# Patient Record
Sex: Female | Born: 1950
Health system: Southern US, Community
[De-identification: ages and names within clinical notes are randomized; demographics above are authoritative.]

## PROBLEM LIST (undated history)

## (undated) DIAGNOSIS — R55 Syncope and collapse: Secondary | ICD-10-CM

## (undated) DIAGNOSIS — T7840XA Allergy, unspecified, initial encounter: Secondary | ICD-10-CM

## (undated) DIAGNOSIS — C44311 Basal cell carcinoma of skin of nose: Secondary | ICD-10-CM

## (undated) HISTORY — DX: Syncope and collapse: R55

## (undated) HISTORY — DX: Basal cell carcinoma of skin of nose: C44.311

## (undated) HISTORY — DX: Allergy, unspecified, initial encounter: T78.40XA

---
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
## 2005-11-02 ENCOUNTER — Ambulatory Visit: Payer: Self-pay | Admitting: Internal Medicine

## 2005-11-16 ENCOUNTER — Other Ambulatory Visit: Admission: RE | Admit: 2005-11-16 | Discharge: 2005-11-16 | Payer: Self-pay | Admitting: Family Medicine

## 2005-11-16 ENCOUNTER — Ambulatory Visit: Payer: Self-pay | Admitting: Family Medicine

## 2005-11-16 ENCOUNTER — Encounter: Payer: Self-pay | Admitting: Family Medicine

## 2005-12-01 ENCOUNTER — Encounter: Admission: RE | Admit: 2005-12-01 | Discharge: 2005-12-01 | Payer: Self-pay | Admitting: Family Medicine

## 2006-05-24 ENCOUNTER — Emergency Department (HOSPITAL_COMMUNITY): Admission: EM | Admit: 2006-05-24 | Discharge: 2006-05-24 | Payer: Self-pay | Admitting: Emergency Medicine

## 2009-04-14 ENCOUNTER — Telehealth: Payer: Self-pay | Admitting: Family Medicine

## 2009-06-26 ENCOUNTER — Emergency Department (HOSPITAL_BASED_OUTPATIENT_CLINIC_OR_DEPARTMENT_OTHER): Admission: EM | Admit: 2009-06-26 | Discharge: 2009-06-26 | Payer: Self-pay | Admitting: Emergency Medicine

## 2010-03-29 NOTE — Progress Notes (Signed)
Summary: SICK WITH STOMACH BUG--NEEDS SUPPOSITORY//FYI ED- call tomorrow  Phone Note Call from Patient Call back at Home Phone [phone] Call back at [phone]  OR [phone]   Caller: Daughter Summary of Call: PATIENTS DAUGHTER CAME IN--FILLED OUT TRIAGE FORM STATING THAT HER MOM WAS REALLY SICK WITH STOMACH BUG---TOO SICK TO COME IN FOR AN OFFICE VISIT  DAUGHTER SAYS SHE NEEDS A "NAUSEA SUPPOSITORY MEDICATION" CALLED IN TO [facility] ON [address]  PATIENT HAS SEEN DR PAZ ONCE IN 11/02/2005 FOR A PHYSICAL Initial call taken by: Jerolyn Shin,  April 14, 2009 10:48 AM  Follow-up for Phone Call        Spoke with pt husband, who says wife has N & V since 3 am, wondering if flu, wanted to know if we could give rx for suppository. Pt not seen since 11/16/05 by Dr Laury Axon  Recommend pt to ED since signs so severe and pt may need IV fluids. Can make ov followup, but recommend ED for today,  husband agreed will go to [facility] .Kandice Hams  April 14, 2009 11:24 AM  Follow-up by: Kandice Hams,  April 14, 2009 11:24 AM  Additional Follow-up for Phone Call Additional follow up Details #1::        call tomorrow to see how she is  Additional Follow-up by: Loreen Freud DO,  April 14, 2009 12:33 PM    Additional Follow-up for Phone Call Additional follow up Details #2::    Spoke with husband he said he could not get her to hospital yesterday, she is weak today. She is able to keep toast and milk down today though. No fever though. Army Fossa CMA  April 15, 2009 9:49 AM   Additional Follow-up for Phone Call Additional follow up Details #3:: Details for Additional Follow-up Action Taken: does she still need phenergan?  Supp or by mouth?  Pts husband says he does not think she needs this now, no longer having diarrhea or vomitting now. Army Fossa CMA  April 15, 2009 11:56 AM  Additional Follow-up by: Loreen Freud DO,  April 15, 2009 10:37 AM

## 2011-12-06 ENCOUNTER — Encounter: Payer: Self-pay | Admitting: Family Medicine

## 2011-12-06 ENCOUNTER — Ambulatory Visit (INDEPENDENT_AMBULATORY_CARE_PROVIDER_SITE_OTHER): Payer: BC Managed Care – PPO | Admitting: Family Medicine

## 2011-12-06 VITALS — BP 101/81 | HR 90 | Temp 98.1°F | Ht 62.25 in | Wt 124.0 lb

## 2011-12-06 DIAGNOSIS — J309 Allergic rhinitis, unspecified: Secondary | ICD-10-CM

## 2011-12-06 DIAGNOSIS — R0982 Postnasal drip: Secondary | ICD-10-CM | POA: Insufficient documentation

## 2011-12-06 MED ORDER — MOMETASONE FUROATE 50 MCG/ACT NA SUSP: 2.0000 | Freq: Every day | NASAL | Status: DC

## 2011-12-06 NOTE — Progress Notes (Signed)
  Subjective:    Patient ID: Jill Norris, female    DOB: 24-Jul-1950, 61 y.o.   MRN: [id number]  HPI URI- chronic nasal congestion, clear nasal drainage.  'unable to blow it out but i can spit it out'.  +PND, cough- sounds wet, but is mostly dry.  Had no sxs while on recent cruise.  No fevers.  No ear pain.  Denies facial pain/pressure.  No hx of seasonal allergies.  Not currently on meds.  sxs have been intermittent for over a year.   Review of Systems     Objective:   Physical Exam  Vitals reviewed. Constitutional: She appears well-developed and well-nourished. No distress.  HENT:  Head: Normocephalic and atraumatic.  Right Ear: Tympanic membrane normal.  Left Ear: Tympanic membrane normal.  Nose: Mucosal edema and rhinorrhea present. Right sinus exhibits no maxillary sinus tenderness and no frontal sinus tenderness. Left sinus exhibits no maxillary sinus tenderness and no frontal sinus tenderness.  Mouth/Throat: Mucous membranes are normal. Posterior oropharyngeal erythema (w/ PND) present.  Eyes: Conjunctivae normal and EOM are normal. Pupils are equal, round, and reactive to light.  Neck: Normal range of motion. Neck supple.  Cardiovascular: Normal rate, regular rhythm and normal heart sounds.   Pulmonary/Chest: Effort normal and breath sounds normal. No respiratory distress. She has no wheezes. She has no rales.  Lymphadenopathy:    She has no cervical adenopathy.          Assessment & Plan:

## 2011-12-06 NOTE — Assessment & Plan Note (Signed)
New.  Likely the cause of pt's ongoing cough.  Start OTC antihistamine, nasal steroid spray- sample and prescription given.  Reviewed supportive care and red flags that should prompt return.  Pt expressed understanding and is in agreement w/ plan.

## 2011-12-06 NOTE — Patient Instructions (Addendum)
This all appears to be due to untreated seasonal allergies Start Claritin or Zyrtec daily Use the nasal spray- 2 sprays each nostril daily Drink plenty of fluids REST! Hang in there!

## 2011-12-06 NOTE — Assessment & Plan Note (Signed)
New.  Cause of pt's ongoing cough.  See above.

## 2013-02-10 ENCOUNTER — Emergency Department (HOSPITAL_BASED_OUTPATIENT_CLINIC_OR_DEPARTMENT_OTHER): Payer: BC Managed Care – PPO

## 2013-02-10 ENCOUNTER — Telehealth: Payer: Self-pay | Admitting: *Deleted

## 2013-02-10 ENCOUNTER — Emergency Department (HOSPITAL_BASED_OUTPATIENT_CLINIC_OR_DEPARTMENT_OTHER)
Admission: EM | Admit: 2013-02-10 | Discharge: 2013-02-10 | Disposition: A | Discharge status: Home / Self Care | Payer: BC Managed Care – PPO | Attending: Emergency Medicine | Admitting: Emergency Medicine

## 2013-02-10 ENCOUNTER — Encounter (HOSPITAL_BASED_OUTPATIENT_CLINIC_OR_DEPARTMENT_OTHER): Payer: Self-pay | Admitting: Emergency Medicine

## 2013-02-10 DIAGNOSIS — IMO0002 Reserved for concepts with insufficient information to code with codable children: Secondary | ICD-10-CM | POA: Insufficient documentation

## 2013-02-10 DIAGNOSIS — R55 Syncope and collapse: Secondary | ICD-10-CM | POA: Insufficient documentation

## 2013-02-10 DIAGNOSIS — F17200 Nicotine dependence, unspecified, uncomplicated: Secondary | ICD-10-CM | POA: Insufficient documentation

## 2013-02-10 DIAGNOSIS — R259 Unspecified abnormal involuntary movements: Secondary | ICD-10-CM | POA: Insufficient documentation

## 2013-02-10 DIAGNOSIS — R7309 Other abnormal glucose: Secondary | ICD-10-CM | POA: Insufficient documentation

## 2013-02-10 DIAGNOSIS — Z8709 Personal history of other diseases of the respiratory system: Secondary | ICD-10-CM | POA: Insufficient documentation

## 2013-02-10 DIAGNOSIS — R5381 Other malaise: Secondary | ICD-10-CM | POA: Insufficient documentation

## 2013-02-10 DIAGNOSIS — M542 Cervicalgia: Secondary | ICD-10-CM | POA: Insufficient documentation

## 2013-02-10 DIAGNOSIS — R739 Hyperglycemia, unspecified: Secondary | ICD-10-CM

## 2013-02-10 LAB — COMPREHENSIVE METABOLIC PANEL
AST: 20 U/L (ref 0–37)
Albumin: 4.3 g/dL (ref 3.5–5.2)
Calcium: 9.5 mg/dL (ref 8.4–10.5)
Creatinine, Ser: 0.6 mg/dL (ref 0.50–1.10)
Total Protein: 7 g/dL (ref 6.0–8.3)

## 2013-02-10 LAB — CBC WITH DIFFERENTIAL/PLATELET
Basophils Absolute: 0 10*3/uL (ref 0.0–0.1)
Basophils Relative: 0 % (ref 0–1)
HCT: 39.5 % (ref 36.0–46.0)
Hemoglobin: 12.6 g/dL (ref 12.0–15.0)
Lymphocytes Relative: 27 % (ref 12–46)
Lymphs Abs: 2.3 10*3/uL (ref 0.7–4.0)
Monocytes Absolute: 0.6 10*3/uL (ref 0.1–1.0)
Monocytes Relative: 7 % (ref 3–12)
Neutro Abs: 5.6 10*3/uL (ref 1.7–7.7)
RBC: 4.03 MIL/uL (ref 3.87–5.11)
WBC: 8.5 10*3/uL (ref 4.0–10.5)

## 2013-02-10 LAB — TROPONIN I: Troponin I: <0.3 ng/mL (ref <0.30)

## 2013-02-10 LAB — GLUCOSE, CAPILLARY

## 2013-02-10 MED ORDER — SODIUM CHLORIDE 0.9 % IV BOLUS (SEPSIS)
1000.0000 mL | Freq: Once | INTRAVENOUS | Status: AC
  Administered 2013-02-10: 1000 mL via INTRAVENOUS

## 2013-02-10 NOTE — ED Provider Notes (Signed)
CSN: [id number]     Arrival date & time 02/10/13  1301 History  This chart was scribed for Geoffery Lyons, MD by Clydene Laming, ED Scribe. This patient was seen in room MH11/MH11 and the patient's care was started at 1:32 PM Chief Complaint  Patient presents with  . Loss of Consciousness    The history is provided by the patient and a relative. No language interpreter was used.   HPI Comments: FRONNIE URTON is a 62 y.o. female who presents to the Emergency Department complaining of loss of consciousness and neck pain with an associated fall today just before arrival. Pt was at [facility] standing still on an elevated surface outside. Pt was on the phone with her daughter just before she passed out and she stated "I feel woozy". He husband pulled up and found her laying flat on the asphalt with half of her body in the road. Pt is not showing any exterior bruising, bleeding, or swelling. Pt has a hx of syncope one year ago with an associated sinus infection. Pt had eaten very little last night and this morning. She was given a Coke on the way here. Pt denies a hx of heart or lung problems and is not currently taking any medications. Pt has not had a physical in 6-8 years.     History reviewed. No pertinent past medical history. History reviewed. No pertinent past surgical history. No family history on file. History  Substance Use Topics  . Smoking status: Current Every Day Smoker -- 0.50 packs/day for 40 years  . Smokeless tobacco: Not on file  . Alcohol Use: No   OB History   Grav Para Term Preterm Abortions TAB SAB Ect Mult Living                 Review of Systems  Musculoskeletal: Positive for neck pain.  Neurological: Positive for tremors, syncope and weakness.    Allergies  Review of patient's allergies indicates no known allergies.  Home Medications   Current Outpatient Rx  Name  Route  Sig  Dispense  Refill  . mometasone (NASONEX) 50 MCG/ACT nasal spray   Nasal   Place  2 sprays into the nose daily.   17 g   12    Triage Vitals: BP:104/54  Pulse 74  Temp(Src) 97.9 F (36.6 C) (Oral)  Resp 20  Ht 5\' 3"  (1.6 m)  Wt 125 lb (56.7 kg)  BMI 22.15 kg/m2  SpO2 98% Physical Exam  Nursing note and vitals reviewed. Constitutional: She is oriented to person, place, and time. She appears well-developed and well-nourished. No distress.  HENT:  Head: Normocephalic and atraumatic.  Eyes: EOM are normal.  Neck: Normal range of motion.  Cardiovascular: Normal rate, regular rhythm and normal heart sounds.   Pulmonary/Chest: Effort normal and breath sounds normal.  Abdominal: Soft. She exhibits no distension. There is no tenderness.  Musculoskeletal: Normal range of motion.  Neurological: She is alert and oriented to person, place, and time.  Skin: Skin is warm and dry.  Psychiatric: She has a normal mood and affect. Judgment normal.    ED Course  Procedures (including critical care time) DIAGNOSTIC STUDIES: Oxygen Saturation is 98% on RA, normal by my interpretation.    COORDINATION OF CARE: 1:43 PM- Discussed treatment plan with pt at bedside. Pt verbalized understanding and agreement with plan.   Labs Review Labs Reviewed  GLUCOSE, CAPILLARY - Abnormal; Notable for the following:    Glucose-Capillary 115 (*)  All other components within normal limits   Imaging Review No results found.  EKG Interpretation    Date/Time:  Monday February 10 2013 14:00:02 EST Ventricular Rate:  82 PR Interval:  126 QRS Duration: 90 QT Interval:  392 QTC Calculation: 457 R Axis:   83 Text Interpretation:  Sinus rhythm with Premature supraventricular complexes Otherwise normal ECG Confirmed by DELOS  MD, Aceson Labell ([id number]) on 02/10/2013 2:46:17 PM            MDM  No diagnosis found. Patient is a 62 year old female presents after a syncopal episode that occurred while shopping. She has no complaints with the exception of soft tissue neck discomfort. There  is no bony tenderness and I do not feel as though imaging is indicated into the cervical spine. Head CT is unremarkable remainder of laboratory studies are unremarkable as well with the exception of an elevated glucose. She has a followup appointment with her primary doctor on Thursday and I have advised her to address this at that time. I suspect this is some sort of vasovagal syncope related to transient hypotension as I found nothing else in the workup to explain it. Her EKG is unremarkable. To return as needed if symptoms worsen or change.   I personally performed the services described in this documentation, which was scribed in my presence. The recorded information has been reviewed and is accurate.    Geoffery Lyons, MD 02/10/13 [phone]

## 2013-02-10 NOTE — ED Notes (Signed)
Pt sts she was talking on the phone and felt nauseous and passed out. Pt c/o head and neck pain, no tenderness to palpation. Pt sts she has been dieting and hasn't eaten much recently. Pt reports she passed out years ago.

## 2013-02-10 NOTE — Telephone Encounter (Signed)
Patient presented to the office accompanied by her husband. Her husband reports he found her "face down on the concrete with her arms behind her." Patient only remember "feeling woozy, and then waking up to her husband helping her up." I advsied the husband that the patient needed to be evaluated at the [facility], he agreed to transport her there. Follow up appt made with Dr. Beverely Low on 02/13/13 at 1:00pm.

## 2013-02-10 NOTE — ED Notes (Signed)
MD at bedside. 

## 2013-02-12 ENCOUNTER — Telehealth: Payer: Self-pay

## 2013-02-12 NOTE — Telephone Encounter (Signed)
Medication and allergies: no medication or allergies  90 day supply/mail order: na Local pharmacy: na   Immunizations due:  No records  A/P: Patient sleeping   Spoke with spouse who states that the patient has not had a CPE in a very long time Not up to date on any HM

## 2013-02-13 ENCOUNTER — Ambulatory Visit (INDEPENDENT_AMBULATORY_CARE_PROVIDER_SITE_OTHER): Payer: BC Managed Care – PPO | Admitting: Family Medicine

## 2013-02-13 ENCOUNTER — Ambulatory Visit: Payer: BC Managed Care – PPO | Admitting: Family Medicine

## 2013-02-13 ENCOUNTER — Encounter: Payer: Self-pay | Admitting: Family Medicine

## 2013-02-13 VITALS — BP 110/66 | HR 81 | Temp 98.2°F | Resp 16 | Wt 127.0 lb

## 2013-02-13 DIAGNOSIS — R55 Syncope and collapse: Secondary | ICD-10-CM

## 2013-02-13 NOTE — Progress Notes (Signed)
   Subjective:    Patient ID: Jill Norris, female    DOB: 1950/06/21, 62 y.o.   MRN: [id number]  HPI Syncope- pt passed out while shopping at [facility] on 12/15.  Went to ER and had normal head CT and EKG.  Pt reports some nasal congestion and facial pressure since passing out.  No fevers.  No dizziness.  Pt had not eaten prior to passing out.  Pt denies feeling poorly prior to passing out.  Pt had feeling of 'woozy' before passing out.  Could still hear daughter talking on the phone but could not respond.   Review of Systems For ROS see HPI     Objective:   Physical Exam  Vitals reviewed. Constitutional: She is oriented to person, place, and time. She appears well-developed and well-nourished. No distress.  HENT:  Head: Normocephalic and atraumatic.  Eyes: Conjunctivae and EOM are normal. Pupils are equal, round, and reactive to light.  Neck: Normal range of motion. Neck supple. No thyromegaly present.  Cardiovascular: Normal rate, regular rhythm, normal heart sounds and intact distal pulses.   No murmur heard. Pulmonary/Chest: Effort normal and breath sounds normal. No respiratory distress.  Abdominal: Soft. She exhibits no distension. There is no tenderness.  Musculoskeletal: She exhibits no edema.  Lymphadenopathy:    She has no cervical adenopathy.  Neurological: She is alert and oriented to person, place, and time.  Skin: Skin is warm and dry.  Psychiatric: She has a normal mood and affect. Her behavior is normal.          Assessment & Plan:

## 2013-02-13 NOTE — Patient Instructions (Signed)
Schedule your complete physical at your convenience Make sure you change positions slowly to allow your BP time to adjust Drink plenty of fluids Consider increasing salt Sit when feeling fatigued or woozy Call with any questions or concerns Happy Holidays!!!

## 2013-02-13 NOTE — Progress Notes (Signed)
Pre visit review using our clinic review tool, if applicable. No additional management support is needed unless otherwise documented below in the visit note. 

## 2013-02-14 LAB — BASIC METABOLIC PANEL
CO2: 26 mEq/L (ref 19–32)
Calcium: 9.6 mg/dL (ref 8.4–10.5)
Chloride: 108 mEq/L (ref 96–112)
Potassium: 4.2 mEq/L (ref 3.5–5.1)
Sodium: 143 mEq/L (ref 135–145)

## 2013-02-14 LAB — LIPID PANEL
Total CHOL/HDL Ratio: 3
Triglycerides: 51 mg/dL (ref 0.0–149.0)

## 2013-02-18 NOTE — Assessment & Plan Note (Signed)
New.  Check labs to assess for metabolic cause.  Suspect pt had component of hypoglycemia since she had not eaten that morning.  Encouraged increased hydration, salt intake, change positions slowly.  Will follow.

## 2013-02-21 ENCOUNTER — Encounter: Payer: Self-pay | Admitting: General Practice

## 2015-07-01 DIAGNOSIS — H52221 Regular astigmatism, right eye: Secondary | ICD-10-CM | POA: Diagnosis not present

## 2015-07-01 DIAGNOSIS — H2513 Age-related nuclear cataract, bilateral: Secondary | ICD-10-CM | POA: Diagnosis not present

## 2015-07-01 DIAGNOSIS — H04123 Dry eye syndrome of bilateral lacrimal glands: Secondary | ICD-10-CM | POA: Diagnosis not present

## 2015-07-01 DIAGNOSIS — H5201 Hypermetropia, right eye: Secondary | ICD-10-CM | POA: Diagnosis not present

## 2015-07-01 DIAGNOSIS — H524 Presbyopia: Secondary | ICD-10-CM | POA: Diagnosis not present

## 2015-07-01 DIAGNOSIS — H5202 Hypermetropia, left eye: Secondary | ICD-10-CM | POA: Diagnosis not present

## 2016-08-16 DIAGNOSIS — H524 Presbyopia: Secondary | ICD-10-CM | POA: Diagnosis not present

## 2016-08-16 DIAGNOSIS — H5202 Hypermetropia, left eye: Secondary | ICD-10-CM | POA: Diagnosis not present

## 2016-08-16 DIAGNOSIS — H04123 Dry eye syndrome of bilateral lacrimal glands: Secondary | ICD-10-CM | POA: Diagnosis not present

## 2016-08-16 DIAGNOSIS — H2513 Age-related nuclear cataract, bilateral: Secondary | ICD-10-CM | POA: Diagnosis not present

## 2017-11-06 DIAGNOSIS — H524 Presbyopia: Secondary | ICD-10-CM | POA: Diagnosis not present

## 2017-11-06 DIAGNOSIS — H52223 Regular astigmatism, bilateral: Secondary | ICD-10-CM | POA: Diagnosis not present

## 2017-11-06 DIAGNOSIS — H5203 Hypermetropia, bilateral: Secondary | ICD-10-CM | POA: Diagnosis not present

## 2018-09-26 ENCOUNTER — Other Ambulatory Visit: Payer: Self-pay

## 2019-06-11 DIAGNOSIS — H16103 Unspecified superficial keratitis, bilateral: Secondary | ICD-10-CM | POA: Diagnosis not present

## 2019-06-11 DIAGNOSIS — H16001 Unspecified corneal ulcer, right eye: Secondary | ICD-10-CM | POA: Diagnosis not present

## 2019-06-13 DIAGNOSIS — H16103 Unspecified superficial keratitis, bilateral: Secondary | ICD-10-CM | POA: Diagnosis not present

## 2019-06-13 DIAGNOSIS — H16001 Unspecified corneal ulcer, right eye: Secondary | ICD-10-CM | POA: Diagnosis not present

## 2019-06-18 DIAGNOSIS — H16103 Unspecified superficial keratitis, bilateral: Secondary | ICD-10-CM | POA: Diagnosis not present

## 2019-06-18 DIAGNOSIS — H16001 Unspecified corneal ulcer, right eye: Secondary | ICD-10-CM | POA: Diagnosis not present

## 2019-06-24 DIAGNOSIS — H16001 Unspecified corneal ulcer, right eye: Secondary | ICD-10-CM | POA: Diagnosis not present

## 2020-07-01 ENCOUNTER — Encounter: Payer: Self-pay | Admitting: Family Medicine

## 2020-07-01 ENCOUNTER — Other Ambulatory Visit: Payer: Self-pay

## 2020-07-01 ENCOUNTER — Ambulatory Visit (INDEPENDENT_AMBULATORY_CARE_PROVIDER_SITE_OTHER): Payer: BC Managed Care – PPO | Admitting: Family Medicine

## 2020-07-01 VITALS — BP 90/60 | HR 79 | Temp 98.2°F | Ht 61.25 in | Wt 128.8 lb

## 2020-07-01 DIAGNOSIS — Z23 Encounter for immunization: Secondary | ICD-10-CM | POA: Diagnosis not present

## 2020-07-01 DIAGNOSIS — Z1283 Encounter for screening for malignant neoplasm of skin: Secondary | ICD-10-CM

## 2020-07-01 DIAGNOSIS — Z13820 Encounter for screening for osteoporosis: Secondary | ICD-10-CM

## 2020-07-01 DIAGNOSIS — Z0000 Encounter for general adult medical examination without abnormal findings: Secondary | ICD-10-CM | POA: Diagnosis not present

## 2020-07-01 DIAGNOSIS — Z780 Asymptomatic menopausal state: Secondary | ICD-10-CM

## 2020-07-01 DIAGNOSIS — Z1211 Encounter for screening for malignant neoplasm of colon: Secondary | ICD-10-CM

## 2020-07-01 DIAGNOSIS — Z13220 Encounter for screening for lipoid disorders: Secondary | ICD-10-CM

## 2020-07-01 DIAGNOSIS — Z1231 Encounter for screening mammogram for malignant neoplasm of breast: Secondary | ICD-10-CM | POA: Diagnosis not present

## 2020-07-01 DIAGNOSIS — Z1159 Encounter for screening for other viral diseases: Secondary | ICD-10-CM | POA: Diagnosis not present

## 2020-07-01 DIAGNOSIS — Z1321 Encounter for screening for nutritional disorder: Secondary | ICD-10-CM

## 2020-07-01 NOTE — Progress Notes (Signed)
Jill Norris is a 70 y.o. female  Chief Complaint  Patient presents with  . Establish Care    Np here to est care, pt not fasting today, pt not UTD on mammo, Dexa or colonoscopy.  Pt need PCV 13 vaccine but would like to discuss Shingles injection.    HPI: Jill Norris is a 70 y.o. female patient seen today to establish care with our office and for annual CPE. She is not fasting today. She has not seen a PCP in 7-8 years. Needs referrals for mammo, dexa, colonoscopy. She has never has never had a colonoscopy and declines referral. She has done FIT test in the past. She is agreeable to cologuard. She has not had any pneumonia vaccines. She is agreeable to this today. She does not want to do the shingles vaccine today.  Dental: UTD Vision: UTD  Past Medical History:  Diagnosis Date  . Allergy   . Syncope     Past Surgical History:  Procedure Laterality Date  . TONSILLECTOMY AND ADENOIDECTOMY  1964    Social History   Socioeconomic History  . Marital status: Married    Spouse name: Not on file  . Number of children: Not on file  . Years of education: Not on file  . Highest education level: Not on file  Occupational History  . Not on file  Tobacco Use  . Smoking status: Current Every Day Smoker    Packs/day: 0.50    Years: 40.00    Pack years: 20.00  . Smokeless tobacco: Never Used  Substance and Sexual Activity  . Alcohol use: No  . Drug use: No  . Sexual activity: Yes  Other Topics Concern  . Not on file  Social History Narrative  . Not on file   Social Determinants of Health   Financial Resource Strain: Not on file  Food Insecurity: Not on file  Transportation Needs: Not on file  Physical Activity: Not on file  Stress: Not on file  Social Connections: Not on file  Intimate Partner Violence: Not on file    Family History  Problem Relation Age of Onset  . Diabetes Mother   . Dementia Mother   . Heart disease Father   . Stroke Father       Immunization History  Administered Date(s) Administered  . Tdap 02/13/2010    No outpatient encounter medications on file as of 07/01/2020.   No facility-administered encounter medications on file as of 07/01/2020.     ROS: Gen: no fever, chills  Skin: no rash, itching ENT: no ear pain, ear drainage, nasal congestion, rhinorrhea, sinus pressure, sore throat Eyes: no blurry vision, double vision Resp: no cough, wheeze,SOB CV: no CP, palpitations, LE edema,  GI: no heartburn, n/v/d/c, abd pain GU: no dysuria, urgency, frequency, hematuria  MSK: no joint pain, myalgias, back pain Neuro: no dizziness, headache, weakness, vertigo Psych: no depression, anxiety, insomnia   No Known Allergies  BP 90/60 (BP Location: Left Arm, Patient Position: Sitting, Cuff Size: Normal)   Pulse 79   Temp 98.2 F (36.8 C) (Oral)   Ht 5' 1.25" (1.556 m)   Wt 128 lb 12.8 oz (58.4 kg)   SpO2 96%   BMI 24.14 kg/m   Wt Readings from Last 3 Encounters:  07/01/20 128 lb 12.8 oz (58.4 kg)  02/13/13 127 lb (57.6 kg)  02/10/13 125 lb (56.7 kg)   Temp Readings from Last 3 Encounters:  07/01/20 98.2 F (36.8 C) (Oral)  02/13/13 98.2 F (36.8 C) (Oral)  02/10/13 97.9 F (36.6 C) (Oral)   BP Readings from Last 3 Encounters:  07/01/20 90/60  02/13/13 110/66  02/10/13 (!) 104/50   Pulse Readings from Last 3 Encounters:  07/01/20 79  02/13/13 81  02/10/13 74     Physical Exam Constitutional:      General: She is not in acute distress.    Appearance: She is well-developed.  HENT:     Head: Normocephalic and atraumatic.     Right Ear: Tympanic membrane and ear canal normal.     Left Ear: Tympanic membrane and ear canal normal.     Nose: Nose normal.  Eyes:     Conjunctiva/sclera: Conjunctivae normal.     Pupils: Pupils are equal, round, and reactive to light.  Neck:     Thyroid: No thyromegaly.  Cardiovascular:     Rate and Rhythm: Normal rate and regular rhythm.  Pulmonary:      Effort: Pulmonary effort is normal. No respiratory distress.     Breath sounds: Normal breath sounds. No wheezing or rhonchi.  Abdominal:     General: Bowel sounds are normal. There is no distension.     Palpations: Abdomen is soft. There is no mass.     Tenderness: There is no abdominal tenderness.  Musculoskeletal:     Cervical back: Neck supple.  Lymphadenopathy:     Cervical: No cervical adenopathy.  Skin:    General: Skin is warm and dry.  Neurological:     Mental Status: She is alert and oriented to person, place, and time.     Motor: No abnormal muscle tone.     Coordination: Coordination normal.  Psychiatric:        Behavior: Behavior normal.      A/P:  1. Annual physical exam - discussed importance of regular CV exercise, healthy diet, adequate sleep - due for mammo, dexa - referrals placed today - declines colonoscopy referral but agrees to cologuard - pneumonia vaccine today, Td today; declines shingrix vaccine - CBC; Future - Comprehensive metabolic panel; Future - VITAMIN D 25 Hydroxy (Vit-D Deficiency, Fractures); Future - Lipid panel; Future - next CPE in 1 year  2. Encounter for screening mammogram for malignant neoplasm of breast - MM DIGITAL SCREENING BILATERAL; Future  3. Screening for osteoporosis 4. Postmenopausal estrogen deficiency - DG Bone Density; Future  5. Screening for colon cancer - declines colonoscopy - agrees to cologuard  6. Need for pneumococcal vaccination - Pneumococcal conjugate vaccine 13-valent IM  7. Screening for lipid disorders - Lipid panel; Future  8. Encounter for vitamin deficiency screening - VITAMIN D 25 Hydroxy (Vit-D Deficiency, Fractures); Future  9. Encounter for hepatitis C screening test for low risk patient - Hepatitis C Antibody; Future  10. Need for tetanus booster - Td : Tetanus/diphtheria >7yo Preservative  Free  11. Screening for skin cancer - Ambulatory referral to Dermatology   This visit  occurred during the SARS-CoV-2 public health emergency.  Safety protocols were in place, including screening questions prior to the visit, additional usage of staff PPE, and extensive cleaning of exam room while observing appropriate contact time as indicated for disinfecting solutions.

## 2020-07-01 NOTE — Patient Instructions (Signed)
Health Maintenance, Female Adopting a healthy lifestyle and getting preventive care are important in promoting health and wellness. Ask your health care provider about:  The right schedule for you to have regular tests and exams.  Things you can do on your own to prevent diseases and keep yourself healthy. What should I know about diet, weight, and exercise? Eat a healthy diet  Eat a diet that includes plenty of vegetables, fruits, low-fat dairy products, and lean protein.  Do not eat a lot of foods that are high in solid fats, added sugars, or sodium.   Maintain a healthy weight Body mass index (BMI) is used to identify weight problems. It estimates body fat based on height and weight. Your health care provider can help determine your BMI and help you achieve or maintain a healthy weight. Get regular exercise Get regular exercise. This is one of the most important things you can do for your health. Most adults should:  Exercise for at least 150 minutes each week. The exercise should increase your heart rate and make you sweat (moderate-intensity exercise).  Do strengthening exercises at least twice a week. This is in addition to the moderate-intensity exercise.  Spend less time sitting. Even light physical activity can be beneficial. Watch cholesterol and blood lipids Have your blood tested for lipids and cholesterol at 70 years of age, then have this test every 5 years. Have your cholesterol levels checked more often if:  Your lipid or cholesterol levels are high.  You are older than 70 years of age.  You are at high risk for heart disease. What should I know about cancer screening? Depending on your health history and family history, you may need to have cancer screening at various ages. This may include screening for:  Breast cancer.  Cervical cancer.  Colorectal cancer.  Skin cancer.  Lung cancer. What should I know about heart disease, diabetes, and high blood  pressure? Blood pressure and heart disease  High blood pressure causes heart disease and increases the risk of stroke. This is more likely to develop in people who have high blood pressure readings, are of African descent, or are overweight.  Have your blood pressure checked: ? Every 3-5 years if you are 18-39 years of age. ? Every year if you are 40 years old or older. Diabetes Have regular diabetes screenings. This checks your fasting blood sugar level. Have the screening done:  Once every three years after age 40 if you are at a normal weight and have a low risk for diabetes.  More often and at a younger age if you are overweight or have a high risk for diabetes. What should I know about preventing infection? Hepatitis B If you have a higher risk for hepatitis B, you should be screened for this virus. Talk with your health care provider to find out if you are at risk for hepatitis B infection. Hepatitis C Testing is recommended for:  Everyone born from 1945 through 1965.  Anyone with known risk factors for hepatitis C. Sexually transmitted infections (STIs)  Get screened for STIs, including gonorrhea and chlamydia, if: ? You are sexually active and are younger than 70 years of age. ? You are older than 70 years of age and your health care provider tells you that you are at risk for this type of infection. ? Your sexual activity has changed since you were last screened, and you are at increased risk for chlamydia or gonorrhea. Ask your health care provider   if you are at risk.  Ask your health care provider about whether you are at high risk for HIV. Your health care provider may recommend a prescription medicine to help prevent HIV infection. If you choose to take medicine to prevent HIV, you should first get tested for HIV. You should then be tested every 3 months for as long as you are taking the medicine. Pregnancy  If you are about to stop having your period (premenopausal) and  you may become pregnant, seek counseling before you get pregnant.  Take 400 to 800 micrograms (mcg) of folic acid every day if you become pregnant.  Ask for birth control (contraception) if you want to prevent pregnancy. Osteoporosis and menopause Osteoporosis is a disease in which the bones lose minerals and strength with aging. This can result in bone fractures. If you are 65 years old or older, or if you are at risk for osteoporosis and fractures, ask your health care provider if you should:  Be screened for bone loss.  Take a calcium or vitamin D supplement to lower your risk of fractures.  Be given hormone replacement therapy (HRT) to treat symptoms of menopause. Follow these instructions at home: Lifestyle  Do not use any products that contain nicotine or tobacco, such as cigarettes, e-cigarettes, and chewing tobacco. If you need help quitting, ask your health care provider.  Do not use street drugs.  Do not share needles.  Ask your health care provider for help if you need support or information about quitting drugs. Alcohol use  Do not drink alcohol if: ? Your health care provider tells you not to drink. ? You are pregnant, may be pregnant, or are planning to become pregnant.  If you drink alcohol: ? Limit how much you use to 0-1 drink a day. ? Limit intake if you are breastfeeding.  Be aware of how much alcohol is in your drink. In the [place] one drink equals one 12 oz bottle of beer (355 mL), one 5 oz glass of wine (148 mL), or one 1 oz glass of hard liquor (44 mL). General instructions  Schedule regular health, dental, and eye exams.  Stay current with your vaccines.  Tell your health care provider if: ? You often feel depressed. ? You have ever been abused or do not feel safe at home. Summary  Adopting a healthy lifestyle and getting preventive care are important in promoting health and wellness.  Follow your health care provider's instructions about healthy  diet, exercising, and getting tested or screened for diseases.  Follow your health care provider's instructions on monitoring your cholesterol and blood pressure. This information is not intended to replace advice given to you by your health care provider. Make sure you discuss any questions you have with your health care provider. Document Revised: 02/06/2018 Document Reviewed: 02/06/2018 Elsevier Patient Education  2021 Elsevier Inc.  

## 2020-07-02 ENCOUNTER — Other Ambulatory Visit (INDEPENDENT_AMBULATORY_CARE_PROVIDER_SITE_OTHER): Payer: PPO

## 2020-07-02 DIAGNOSIS — Z1159 Encounter for screening for other viral diseases: Secondary | ICD-10-CM | POA: Diagnosis not present

## 2020-07-02 DIAGNOSIS — Z1321 Encounter for screening for nutritional disorder: Secondary | ICD-10-CM | POA: Diagnosis not present

## 2020-07-02 DIAGNOSIS — Z13220 Encounter for screening for lipoid disorders: Secondary | ICD-10-CM

## 2020-07-02 DIAGNOSIS — Z0000 Encounter for general adult medical examination without abnormal findings: Secondary | ICD-10-CM | POA: Diagnosis not present

## 2020-07-02 LAB — LIPID PANEL
Cholesterol: 201 mg/dL — ABNORMAL HIGH (ref 0–200)
HDL: 80.3 mg/dL (ref >39.00)
LDL Cholesterol: 109 mg/dL — ABNORMAL HIGH (ref 0–99)
NonHDL: 120.3
Total CHOL/HDL Ratio: 2
Triglycerides: 56 mg/dL (ref 0.0–149.0)
VLDL: 11.2 mg/dL (ref 0.0–40.0)

## 2020-07-02 LAB — COMPREHENSIVE METABOLIC PANEL
ALT: 14 U/L (ref 0–35)
AST: 19 U/L (ref 0–37)
Albumin: 4.6 g/dL (ref 3.5–5.2)
Alkaline Phosphatase: 59 U/L (ref 39–117)
BUN: 13 mg/dL (ref 6–23)
CO2: 29 mEq/L (ref 19–32)
Calcium: 9.3 mg/dL (ref 8.4–10.5)
Chloride: 105 mEq/L (ref 96–112)
Creatinine, Ser: 0.64 mg/dL (ref 0.40–1.20)
GFR: 89.75 mL/min (ref >60.00)
Glucose, Bld: 91 mg/dL (ref 70–99)
Potassium: 4.2 mEq/L (ref 3.5–5.1)
Sodium: 142 mEq/L (ref 135–145)
Total Bilirubin: 0.6 mg/dL (ref 0.2–1.2)
Total Protein: 6.7 g/dL (ref 6.0–8.3)

## 2020-07-02 LAB — CBC
HCT: 39.4 % (ref 36.0–46.0)
Hemoglobin: 13.3 g/dL (ref 12.0–15.0)
MCHC: 33.7 g/dL (ref 30.0–36.0)
MCV: 95.5 fl (ref 78.0–100.0)
Platelets: 273 10*3/uL (ref 150.0–400.0)
RBC: 4.13 Mil/uL (ref 3.87–5.11)
RDW: 13.1 % (ref 11.5–15.5)
WBC: 5.8 10*3/uL (ref 4.0–10.5)

## 2020-07-02 LAB — VITAMIN D 25 HYDROXY (VIT D DEFICIENCY, FRACTURES): VITD: 55.87 ng/mL (ref 30.00–100.00)

## 2020-07-02 NOTE — Progress Notes (Signed)
Per orders of Dr. Bryan Lemma pt is here for labs pt tolerated lab draw well.

## 2020-07-05 LAB — HEPATITIS C ANTIBODY
Hepatitis C Ab: NONREACTIVE
SIGNAL TO CUT-OFF: 0 (ref <1.00)

## 2020-07-12 DIAGNOSIS — Z1212 Encounter for screening for malignant neoplasm of rectum: Secondary | ICD-10-CM | POA: Diagnosis not present

## 2020-07-12 DIAGNOSIS — Z1211 Encounter for screening for malignant neoplasm of colon: Secondary | ICD-10-CM | POA: Diagnosis not present

## 2020-07-12 LAB — COLOGUARD: Cologuard: NEGATIVE

## 2020-07-15 ENCOUNTER — Encounter: Payer: Self-pay | Admitting: Family Medicine

## 2020-07-21 LAB — COLOGUARD
COLOGUARD: NEGATIVE
Cologuard: NEGATIVE

## 2020-08-25 DIAGNOSIS — D485 Neoplasm of uncertain behavior of skin: Secondary | ICD-10-CM | POA: Diagnosis not present

## 2020-08-25 DIAGNOSIS — X32XXXS Exposure to sunlight, sequela: Secondary | ICD-10-CM | POA: Diagnosis not present

## 2020-08-25 DIAGNOSIS — C44311 Basal cell carcinoma of skin of nose: Secondary | ICD-10-CM | POA: Diagnosis not present

## 2020-08-25 DIAGNOSIS — L821 Other seborrheic keratosis: Secondary | ICD-10-CM | POA: Diagnosis not present

## 2020-08-25 DIAGNOSIS — L570 Actinic keratosis: Secondary | ICD-10-CM | POA: Diagnosis not present

## 2020-08-25 DIAGNOSIS — L814 Other melanin hyperpigmentation: Secondary | ICD-10-CM | POA: Diagnosis not present

## 2020-08-25 DIAGNOSIS — L918 Other hypertrophic disorders of the skin: Secondary | ICD-10-CM | POA: Diagnosis not present

## 2020-08-25 DIAGNOSIS — D1801 Hemangioma of skin and subcutaneous tissue: Secondary | ICD-10-CM | POA: Diagnosis not present

## 2020-08-31 ENCOUNTER — Telehealth: Payer: Self-pay

## 2020-08-31 NOTE — Telephone Encounter (Signed)
Pt called wanting to know if you would accept her back as a pt.  Dr. Bryan Lemma is leaving [place] effective 09/29/2020 and she prefers a doctor vs. A PA or NP.  Please advise.

## 2020-09-08 NOTE — Telephone Encounter (Signed)
Patient knows & understands... trying with grand over

## 2020-09-09 DIAGNOSIS — C44311 Basal cell carcinoma of skin of nose: Secondary | ICD-10-CM | POA: Diagnosis not present

## 2020-09-14 ENCOUNTER — Other Ambulatory Visit: Payer: Self-pay

## 2020-09-14 ENCOUNTER — Ambulatory Visit
Admission: RE | Admit: 2020-09-14 | Discharge: 2020-09-14 | Disposition: A | Discharge status: Home / Self Care | Payer: PPO | Source: Ambulatory Visit | Attending: Family Medicine | Admitting: Family Medicine

## 2020-09-14 DIAGNOSIS — Z1231 Encounter for screening mammogram for malignant neoplasm of breast: Secondary | ICD-10-CM

## 2020-09-16 DIAGNOSIS — C44311 Basal cell carcinoma of skin of nose: Secondary | ICD-10-CM | POA: Diagnosis not present

## 2020-09-21 DIAGNOSIS — D485 Neoplasm of uncertain behavior of skin: Secondary | ICD-10-CM | POA: Diagnosis not present

## 2020-09-23 DIAGNOSIS — C44311 Basal cell carcinoma of skin of nose: Secondary | ICD-10-CM | POA: Insufficient documentation

## 2020-10-26 DIAGNOSIS — D485 Neoplasm of uncertain behavior of skin: Secondary | ICD-10-CM | POA: Diagnosis not present

## 2020-11-08 DIAGNOSIS — H2513 Age-related nuclear cataract, bilateral: Secondary | ICD-10-CM | POA: Diagnosis not present

## 2020-12-08 DIAGNOSIS — U071 COVID-19: Secondary | ICD-10-CM | POA: Diagnosis not present

## 2020-12-29 ENCOUNTER — Other Ambulatory Visit: Payer: PPO

## 2021-01-27 DIAGNOSIS — D485 Neoplasm of uncertain behavior of skin: Secondary | ICD-10-CM | POA: Diagnosis not present

## 2021-03-18 ENCOUNTER — Other Ambulatory Visit: Payer: Self-pay

## 2021-03-18 ENCOUNTER — Ambulatory Visit (INDEPENDENT_AMBULATORY_CARE_PROVIDER_SITE_OTHER): Payer: PPO

## 2021-03-18 ENCOUNTER — Ambulatory Visit (INDEPENDENT_AMBULATORY_CARE_PROVIDER_SITE_OTHER): Payer: PPO | Admitting: Family Medicine

## 2021-03-18 VITALS — BP 120/74 | HR 76 | Temp 97.0°F | Ht 61.25 in | Wt 127.0 lb

## 2021-03-18 DIAGNOSIS — S8392XA Sprain of unspecified site of left knee, initial encounter: Secondary | ICD-10-CM | POA: Diagnosis not present

## 2021-03-18 DIAGNOSIS — S8992XA Unspecified injury of left lower leg, initial encounter: Secondary | ICD-10-CM | POA: Diagnosis not present

## 2021-03-18 MED ORDER — NAPROXEN 500 MG PO TABS: 500.0000 mg | ORAL_TABLET | Freq: Two times a day (BID) | ORAL | 0 refills | Status: DC

## 2021-03-18 NOTE — Patient Instructions (Signed)
Knee Sprain, Adult A knee sprain is a stretch or tear in a knee ligament. Knee ligaments are tissues that connect bones in the knee to each other. What are the causes? This condition often results from: A fall. An injury to the knee. What are the signs or symptoms? Symptoms of this condition include: Trouble straightening or bending the leg. Swelling in the knee. Bruising around the knee. Tenderness or pain in the knee. Muscle spasms around the knee. How is this diagnosed? This condition may be diagnosed based on: A physical exam. A history of what happened just before you started to have symptoms. Tests, including: An X-ray. This may be done to make sure no bones are broken. An MRI. This may be done to check if the ligament is torn. Stress testing of the knee. This may be done to check ligament damage. How is this treated? Treatment for this condition may involve: Keeping the knee still (immobilized) with a cast, brace, or splint. Applying ice to the knee. This helps with pain and swelling. Raising (elevating) the knee above the level of your heart when you are resting. This helps with pain and swelling. Taking medicine for pain. Doing exercises to prevent or limit permanent weakness or stiffness in your knee. Having surgery to reconnect the ligament to the bone or to reconstruct it. This may be needed if the ligament is completely torn. Follow these instructions at home: If you have a splint or brace: Wear it as told by your health care provider. Remove it only as told by your health care provider. Check the skin around it every day. Tell your health care provider about any concerns. Loosen it if your toes tingle, become numb, or turn cold and blue. Keep it clean and dry. If you have a cast: Do not stick anything inside it to scratch your skin. Doing that increases your risk of infection. Check the skin around it every day. Tell your health care provider about any  concerns. You may put lotion on dry skin around the edges of the cast. Do not put lotion on the skin underneath the cast. Keep it clean and dry. Bathing If you have a splint, brace, or cast that is not waterproof: Do not let it get wet. Cover it with a watertight covering when you take a bath or a shower. Managing pain, stiffness, and swelling Move your toes often to reduce stiffness and swelling. Elevate the injured area above the level of your heart while you are sitting or lying down. General instructions Take over-the-counter and prescription medicines only as told by your health care provider. Do not use any products that contain nicotine or tobacco, such as cigarettes, e-cigarettes, and chewing tobacco. These can delay healing. If you need help quitting, ask your health care provider. Do exercises as told by your health care provider. Keep all follow-up visits as told by your health care provider. This is important. Contact a health care provider if: You have pain that gets worse. The cast, brace, or splint does not fit right. The cast, brace, or splint gets damaged. Get help right away if: You cannot use your injured knee to support any of your body weight (cannot bear weight). You cannot move the injured joint. You cannot walk more than a few steps without pain or without your knee buckling. You have significant pain, swelling, or numbness in the leg below the cast, brace, or splint. Your foot or toes are numb, cold, or blue after loosening your  splint or brace. Summary A knee sprain is a stretch or tear in a knee ligament that usually occurs as the result of a fall or injury. Treatment may involve immobilizing the knee with a cast, splint, or brace and then doing exercises. If the ligament is completely torn, it may require surgery to repair or replace the injured ligament. This information is not intended to replace advice given to you by your health care provider. Make sure  you discuss any questions you have with your health care provider. Document Revised: 01/03/2019 Document Reviewed: 01/03/2019 Elsevier Patient Education  [employer].

## 2021-03-18 NOTE — Progress Notes (Signed)
[facility] [address] [place] [address] Dept: [phone] Dept Fax: [phone]  Office Visit  Subjective:    Patient ID: Jill Norris, female    DOB: [birth year], 71 y.o..   MRN: [id number]  Chief Complaint  Patient presents with   Acute Visit    C/o having LT knee pain x 2 weeks. She has been taking Ibuprofen with little relief.        History of Present Illness:  Patient is in today for evaluation of a left knee injury. She notes that she stepped off a a curb into a mulched border 2 weeks ago. Her knee twisted at that time. She had to be helped back into her golf cart by her husband. She initially had pain int he popliteal area. This has resolved, but she now has some persistent pain anteromedially. She has not noted any swelling or bruising. She has no pain with rest, but does not pain with ambulation. She had tried taking ibuprofen 200 mg every 4-6 hours at times, with only mild improvement. Yesterday, she went shopping for 45 min of walking and had a flare of her pain. Last night she took 800 mg of ibuprofen. This did help, but she was worried she might do worse damage to her knee.  Past Medical History: Patient Active Problem List   Diagnosis Date Noted   Basal cell carcinoma of nose 09/23/2020   Syncope 02/13/2013   Allergic rhinitis 12/06/2011   Post-nasal drip 12/06/2011   Past Surgical History:  Procedure Laterality Date   TONSILLECTOMY AND ADENOIDECTOMY  1964   Family History  Problem Relation Age of Onset   Diabetes Mother    Dementia Mother    Heart disease Father    Stroke Father    Outpatient Medications Prior to Visit  Medication Sig Dispense Refill   cholecalciferol (VITAMIN D3) 25 MCG (1000 UNIT) tablet Take 1,000 Units by mouth daily.     Probiotic Product (UP4 PROBIOTICS ADULT PO) Take by mouth.     vitamin B-12 (CYANOCOBALAMIN) 1000 MCG tablet Take 1,000 mcg by mouth daily.     No  facility-administered medications prior to visit.   No Known Allergies    Objective:   Today's Vitals   03/18/21 0953  BP: 120/74  Pulse: 76  Temp: (!) 97 F (36.1 C)  TempSrc: Temporal  SpO2: 98%  Weight: 127 lb (57.6 kg)  Height: 5' 1.25" (1.556 m)   Body mass index is 23.8 kg/m.   General: Well developed, well nourished. No acute distress. Extremities- Left knee: Full ROM. No joint swelling or tenderness. Mild crepitance with flexion/extension.   Varus/valgus testing and Lachman's negative. McMurray's testing negative. Psych: Alert and oriented. Normal mood and affect.  Health Maintenance Due  Topic Date Due   Zoster Vaccines- Shingrix (1 of 2) Never done   COLONOSCOPY (Pts 45-73yrs Insurance coverage will need to be confirmed)  Never done   DEXA SCAN  Never done   COVID-19 Vaccine (3 - Moderna risk series) 01/28/2020   Imaging: Left Knee X-ray: Normal. No fracture or dislocation.    Assessment & Plan:   1. Sprain of left knee, unspecified ligament, initial encounter Recommend she use a knee sleeve for support. She should apply a heating pad int he evenings. I will have her take an anti-inflammatory dose of naproxen for 7 days to see if we can get her issue ot resolve. Follow-up as needed.  - DG Knee Complete  4 Views Left - naproxen (NAPROSYN) 500 MG tablet; Take 1 tablet (500 mg total) by mouth 2 (two) times daily with a meal.  Dispense: 14 tablet; Refill: 0  Haydee Salter, MD

## 2021-07-04 ENCOUNTER — Encounter: Payer: PPO | Admitting: Family Medicine

## 2021-07-04 ENCOUNTER — Encounter: Payer: Self-pay | Admitting: Family Medicine

## 2021-07-04 ENCOUNTER — Ambulatory Visit (INDEPENDENT_AMBULATORY_CARE_PROVIDER_SITE_OTHER): Payer: PPO | Admitting: Family Medicine

## 2021-07-04 VITALS — BP 110/66 | HR 87 | Temp 97.6°F | Ht 61.25 in | Wt 128.6 lb

## 2021-07-04 DIAGNOSIS — G8929 Other chronic pain: Secondary | ICD-10-CM | POA: Diagnosis not present

## 2021-07-04 DIAGNOSIS — F17209 Nicotine dependence, unspecified, with unspecified nicotine-induced disorders: Secondary | ICD-10-CM | POA: Insufficient documentation

## 2021-07-04 DIAGNOSIS — Z23 Encounter for immunization: Secondary | ICD-10-CM

## 2021-07-04 DIAGNOSIS — M25562 Pain in left knee: Secondary | ICD-10-CM

## 2021-07-04 DIAGNOSIS — L84 Corns and callosities: Secondary | ICD-10-CM

## 2021-07-04 NOTE — Progress Notes (Signed)
?[facility] ?[place] ?[place] [address] ?Dept: [phone] ?Dept Fax: [phone] ? ?Transfer of Care Office Visit ? ?Subjective:  ? ? Patient ID: Marylynn Pearson, female    DOB: 1951-01-04, 71 y.o..   MRN: [id number] ? ?Chief Complaint  ?Patient presents with  ? Establish Care  ?  TOC- establish care. No concerns.    ? ? ?History of Present Illness: ? ?Patient is in today to establish care. Ms. Husk was born in [place], [place]. She attended college at [employer], [place] in [place]. She has been married for 49 years. She has two children: a daughter (59) who lives locally and a son (25) who lives in [place]. 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 She smokes 1/2 ppd of cigarettes and has not really considered stopping. She does admit to some sinus issues and morning cough at times. She denies any alcohol or drug use. ? ?Ms. Banko has a history of allergic rhinitis and post-nasal drip. This flares periodically, but she does not take regular medication for this. ? ?Ms. Vankirk has a history of having had a basal cell carcinoma on her nose. She was initially recommended to have Moh's surgery with a reconstructive flap. Ms. 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 She saw a Psychiatric nurse in [facility], who offered her treatment with a topical 5-FU cream, which seems to have resolved the cancer without needing surgery.  ? ?Ms. Dupas notes she has a raised lesion on her 2nd toe. She notes this is not painful. ? ?Ms. Revuelta has a history of left knee pain. This flares periodically, esp. when going down stairs. She notes this bothers her about every other day. It has not limited her activity. She had spraiend her knee back in  Jan. She was prescribed naproxen, but felt this upset her stomach too much. ? ?Past Medical History: ?Patient Active Problem List  ? Diagnosis Date Noted  ? Tobacco use disorder, continuous 07/04/2021  ? Basal cell carcinoma of nose 09/23/2020  ? Syncope 02/13/2013  ? Allergic rhinitis 12/06/2011  ? Post-nasal drip 12/06/2011  ? ?Past Surgical History:  ?Procedure Laterality Date  ? [place]  ? ?Family History  ?Problem Relation Age of Onset  ? Cancer Mother   ?     Pancreatic  ? Diabetes Mother   ? Dementia Mother   ? Heart disease Father   ? Stroke Father   ? Cancer Paternal Uncle   ?     Lung  ? ?Outpatient Medications Prior to Visit  ?Medication Sig Dispense Refill  ? cholecalciferol (VITAMIN D3) 25 MCG (1000 UNIT) tablet Take 1,000 Units by mouth daily.    ? naproxen (NAPROSYN) 500 MG tablet Take 1 tablet (500 mg total) by mouth 2 (two) times daily with a meal. 14 tablet 0  ? Probiotic Product (UP4 PROBIOTICS ADULT PO) Take by mouth.    ? vitamin B-12 (CYANOCOBALAMIN) 1000 MCG tablet Take 1,000 mcg by mouth daily.    ? ?No facility-administered medications prior to visit.  ? ? ?No Known Allergies ?   ?Objective:  ? ?Today's Vitals  ? 07/04/21 0954  ?BP: 110/66  ?Pulse: 87  ?Temp: 97.6 ?F (36.4 ?C)  ?TempSrc: Temporal  ?SpO2: 99%  ?  Weight: 128 lb 9.6 oz (58.3 kg)  ?Height: 5' 1.25" (1.556 m)  ? ?Body mass index is 24.1 kg/m?.  ? ?General: Well developed, well nourished. No acute distress. ?Extremity: There is a 3-4 mm area of firm callus with a central pit over the medial aspect of the right  ? 2nd toe, near the DIP joint.Marland Kitchen ?Psych: Alert and oriented. Normal mood and affect. ? ?Health Maintenance Due  ?Topic Date Due  ? Zoster Vaccines- Shingrix (1 of 2) Never done  ? DEXA SCAN  Never done  ?   ?Assessment & Plan:  ? ?1. Chronic pain of left knee ?Recommend she keep active with walking to maintain mobility. In light of oral NSAIDs bothering her stomach, I recommend she use topical  Voltaren gel to help with discomfort. ? ?2. Tobacco use disorder, continuous ?I advised Ms. Dina to stop smoking. As she is precontemplative about this at present, I offered her assistance should she decide to try.  ? ?3. Corn ?The toe lesion is typicl for a cord. I recommend she use Dr. Felicie Morn pads for this. ? ?4. Need for pneumococcal vaccination ? ?- Pneumococcal polysaccharide vaccine 23-valent greater than or equal to 2yo subcutaneous/IM ? ?Return in about 1 year (around 07/05/2022) for Reassessment.  ? ?Haydee Salter, MD ?

## 2021-07-18 ENCOUNTER — Telehealth: Payer: Self-pay | Admitting: Family Medicine

## 2021-07-18 NOTE — Telephone Encounter (Signed)
Left message for patient to call back and schedule Medicare Annual Wellness Visit (AWV) i.   Please offer to do virtually or by telephone.  Left office number and my jabber [phone].  Due for  AWVI   Please schedule at anytime with Nurse Health Advisor.

## 2021-08-02 ENCOUNTER — Telehealth: Payer: Self-pay | Admitting: Family Medicine

## 2021-08-02 NOTE — Telephone Encounter (Signed)
Left message for patient to call back and schedule Medicare Annual Wellness Visit (AWV).   Please offer to do virtually or by telephone.  Left office number and my jabber [phone].  AWVI eligible as of 05/28/2016  Please schedule at anytime with Nurse Health Advisor.

## 2021-08-09 ENCOUNTER — Ambulatory Visit (INDEPENDENT_AMBULATORY_CARE_PROVIDER_SITE_OTHER): Payer: PPO

## 2021-08-09 DIAGNOSIS — Z1231 Encounter for screening mammogram for malignant neoplasm of breast: Secondary | ICD-10-CM

## 2021-08-09 DIAGNOSIS — Z0000 Encounter for general adult medical examination without abnormal findings: Secondary | ICD-10-CM

## 2021-08-09 NOTE — Progress Notes (Signed)
Subjective:   Jill Norris is a 71 y.o. female who presents for an Initial Medicare Annual Wellness Visit.   I connected with Jill Norris  today by telephone and verified that I am speaking with the correct person using two identifiers. Location patient: home Location provider: work Persons participating in the virtual visit: patient, provider.   I discussed the limitations, risks, security and privacy concerns of performing an evaluation and management service by telephone and the availability of in person appointments. I also discussed with the patient that there may be a patient responsible charge related to this service. The patient expressed understanding and verbally consented to this telephonic visit.    Interactive audio and video telecommunications were attempted between this provider and patient, however failed, due to patient having technical difficulties OR patient did not have access to video capability.  We continued and completed visit with audio only.    Review of Systems    Cardiac Risk Factors include: advanced age (>70mn, >>72women)     Objective:    Today's Vitals   There is no height or weight on file to calculate BMI.     08/09/2021   10:16 AM  Advanced Directives  Does Patient Have a Medical Advance Directive? Yes  [place] of [place]  [place] of [place]? No - copy requested    Current Medications (verified) Outpatient Encounter Medications as of 08/09/2021  Medication Sig   cholecalciferol (VITAMIN D3) 25 MCG (1000 UNIT) tablet Take 1,000 Units by mouth daily.   naproxen (NAPROSYN) 500 MG tablet Take 1 tablet (500 mg total) by mouth 2 (two) times daily with a meal.   Probiotic Product (UP4 PROBIOTICS ADULT PO) Take by mouth.   vitamin B-12 (CYANOCOBALAMIN) 1000 MCG tablet Take 1,000 mcg by mouth daily.   No facility-administered encounter medications on file as of  08/09/2021.    Allergies (verified) Patient has no known allergies.   History: Past Medical History:  Diagnosis Date   Allergy    Basal cell carcinoma (BCC) of nasal tip    Syncope    Past Surgical History:  Procedure Laterality Date   TONSILLECTOMY AND ADENOIDECTOMY  1964   Family History  Problem Relation Age of Onset   Cancer Mother        Pancreatic   Diabetes Mother    Dementia Mother    Heart disease Father    Stroke Father    Cancer Paternal Uncle        Lung   Social History   Socioeconomic History   Marital status: Married    Spouse name: Not on file   Number of children: 2   Years of education: Not on file   Highest education level: Bachelor's degree (e.g., BA, AB, BS)  Occupational History   Not on file  Tobacco Use   Smoking status: Every Day    Packs/day: 0.50    Years: 40.00    Total pack years: 20.00    Types: Cigarettes   Smokeless tobacco: Never  Vaping Use   Vaping Use: Never used  Substance and Sexual Activity   Alcohol use: No   Drug use: No   Sexual activity: Yes  Other Topics Concern   Not on file  Social History Narrative   Not on file   Social Determinants of Health   Financial Resource Strain: Low Risk  (08/09/2021)   Overall Financial Resource Strain (CARDIA)  Difficulty of Paying Living Expenses: Not hard at all  Food Insecurity: No Food Insecurity (08/09/2021)   Hunger Vital Sign    Worried About Running Out of Food in the Last Year: Never true    Ran Out of Food in the Last Year: Never true  Transportation Needs: No Transportation Needs (08/09/2021)   PRAPARE - Hydrologist (Medical): No    Lack of Transportation (Non-Medical): No  Physical Activity: Inactive (08/09/2021)   Exercise Vital Sign    Days of Exercise per Week: 0 days    Minutes of Exercise per Session: 0 min  Stress: No Stress Concern Present (08/09/2021)   [place]    Feeling of Stress : Not at all  Social Connections: Moderately Integrated (08/09/2021)   Social Connection and Isolation Panel [NHANES]    Frequency of Communication with Friends and Family: Three times a week    Frequency of Social Gatherings with Friends and Family: Three times a week    Attends Religious Services: 1 to 4 times per year    Active Member of Clubs or Organizations: No    Attends Archivist Meetings: Never    Marital Status: Married    Tobacco Counseling Ready to quit: Not Answered Counseling given: Not Answered   Clinical Intake:  Pre-visit preparation completed: Yes  Pain : No/denies pain     Nutritional Risks: None Diabetes: No  How often do you need to have someone help you when you read instructions, pamphlets, or other written materials from your doctor or pharmacy?: 1 - Never What is the last grade level you completed in school?: college  Diabetic?no   Interpreter Needed?: No  Information entered by :: [facility]    08/09/2021   10:21 AM  In your present state of health, do you have any difficulty performing the following activities:  Hearing? 0  Vision? 0  Difficulty concentrating or making decisions? 0  Walking or climbing stairs? 0  Dressing or bathing? 0  Doing errands, shopping? 0  Preparing Food and eating ? N  Using the Toilet? N  In the past six months, have you accidently leaked urine? N  Do you have problems with loss of bowel control? N  Managing your Medications? N  Managing your Finances? N  Housekeeping or managing your Housekeeping? N    Patient Care Team: Jill Salter, MD as PCP - General (Family Medicine) Loletta Specter, MD (Plastic Surgery) Shawnie Dapper, DO (Optometry)  Indicate any recent Medical Services you may have received from other than [facility] providers in the past year (date may be approximate).     Assessment:   This is a routine wellness  examination for [place].  Hearing/Vision screen Vision Screening - Comments:: Annual eye exams wears contacts   Dietary issues and exercise activities discussed: Current Exercise Habits: The patient does not participate in regular exercise at present, Exercise limited by: None identified   Goals Addressed   None    Depression Screen    08/09/2021   10:17 AM 08/09/2021   10:14 AM 07/04/2021    9:54 AM 07/01/2020   10:33 AM 02/13/2013    1:01 PM  PHQ 2/9 Scores  PHQ - 2 Score 0 0 0 0 0    Fall Risk    08/09/2021   10:17 AM 07/04/2021    9:54 AM 07/01/2020   10:33 AM  09/26/2018    4:34 PM 02/13/2013    1:01 PM  Fall Risk   Falls in the past year? 0 0 0  No  Comment    Emmi Telephone Survey: data to providers prior to load   Number falls in past yr: 0 0     Comment    Emmi Telephone Survey Actual Response =    Injury with Fall? 0 0     Risk for fall due to :  No Fall Risks     Follow up Falls evaluation completed;Education provided Falls evaluation completed       Ewa Gentry:  Any stairs in or around the home? Yes  If so, are there any without handrails? No  Home free of loose throw rugs in walkways, pet beds, electrical cords, etc? Yes  Adequate lighting in your home to reduce risk of falls? Yes   ASSISTIVE DEVICES UTILIZED TO PREVENT FALLS:  Life alert? No  Use of a cane, walker or w/c? No  Grab bars in the bathroom? No  Shower chair or bench in shower? No  Elevated toilet seat or a handicapped toilet? No     Cognitive Function:    Normal cognitive status assessed by telephone conversation  by this Nurse Health Advisor. No abnormalities found.      Immunizations Immunization History  Administered Date(s) Administered   Influenza Split 11/27/2020   Moderna Sars-Covid-2 Vaccination 03/11/2019, 04/09/2019, 12/31/2019   Pneumococcal Conjugate-13 07/01/2020   Pneumococcal Polysaccharide-23 07/04/2021   Td 07/01/2020   Tdap 02/13/2010     TDAP status: Up to date  Flu Vaccine status: Up to date  Pneumococcal vaccine status: Up to date  Covid-19 vaccine status: Completed vaccines  Qualifies for Shingles Vaccine? Yes   Zostavax completed No   Shingrix Completed?: No.    Education has been provided regarding the importance of this vaccine. Patient has been advised to call insurance company to determine out of pocket expense if they have not yet received this vaccine. Advised may also receive vaccine at local pharmacy or Health Dept. Verbalized acceptance and understanding.  Screening Tests Health Maintenance  Topic Date Due   Zoster Vaccines- Shingrix (1 of 2) Never done   DEXA SCAN  Never done   COVID-19 Vaccine (4 - Booster for Moderna series) 02/25/2020   MAMMOGRAM  09/14/2021   INFLUENZA VACCINE  09/27/2021   Fecal DNA (Cologuard)  07/22/2023   TETANUS/TDAP  07/02/2030   Pneumonia Vaccine 61+ Years old  Completed   Hepatitis C Screening  Completed   HPV VACCINES  Aged Out    Health Maintenance  Health Maintenance Due  Topic Date Due   Zoster Vaccines- Shingrix (1 of 2) Never done   DEXA SCAN  Never done   COVID-19 Vaccine (4 - Booster for Moderna series) 02/25/2020    Colorectal cancer screening: Type of screening: Cologuard. Completed 07/21/2020. Repeat every 3 years  Mammogram status: Completed 09/14/2020. Repeat every year  Bone Density status: Ordered declined at this time . Pt provided with contact info and advised to call to schedule appt.  Lung Cancer Screening: (Low Dose CT Chest recommended if Age 28-80 years, 30 pack-year currently smoking OR have quit w/in 15years.) does not qualify.   Lung Cancer Screening Referral: n/a  Additional Screening:  Hepatitis C Screening: does qualify; Completed 07/02/2020  Vision Screening: Recommended annual ophthalmology exams for early detection of glaucoma and other disorders of the eye. Is the patient up  to date with their annual eye exam?  Yes   Who is the provider or what is the name of the office in which the patient attends annual eye exams? Dr.Parker  If pt is not established with a provider, would they like to be referred to a provider to establish care? No .   Dental Screening: Recommended annual dental exams for proper oral hygiene  Community Resource Referral / Chronic Care Management: CRR required this visit?  No   CCM required this visit?  No      Plan:     I have personally reviewed and noted the following in the patient's chart:   Medical and social history Use of alcohol, tobacco or illicit drugs  Current medications and supplements including opioid prescriptions. Patient is not currently taking opioid prescriptions. Functional ability and status Nutritional status Physical activity Advanced directives List of other physicians Hospitalizations, surgeries, and ER visits in previous 12 months Vitals Screenings to include cognitive, depression, and falls Referrals and appointments  In addition, I have reviewed and discussed with patient certain preventive protocols, quality metrics, and best practice recommendations. A written personalized care plan for preventive services as well as general preventive health recommendations were provided to patient.     Randel Pigg, LPN   3/71/0626   Nurse Notes: none

## 2021-08-09 NOTE — Patient Instructions (Signed)
Jill Norris , Thank you for taking time to come for your Medicare Wellness Visit. I appreciate your ongoing commitment to your health goals. Please review the following plan we discussed and let me know if I can assist you in the future.   Screening recommendations/referrals: Colonoscopy: Cologuard  07/21/2020 Mammogram: 09/13/2020 Bone Density: declined at this time Recommended yearly ophthalmology/optometry visit for glaucoma screening and checkup Recommended yearly dental visit for hygiene and checkup  Vaccinations: Influenza vaccine: completed  Pneumococcal vaccine: completed  Tdap vaccine: 02/13/2010 Shingles vaccine: will consider     Advanced directives: yes   Conditions/risks identified: none   Next appointment: none   Preventive Care 24 Years and Older, Female Preventive care refers to lifestyle choices and visits with your health care provider that can promote health and wellness. What does preventive care include? A yearly physical exam. This is also called an annual well check. Dental exams once or twice a year. Routine eye exams. Ask your health care provider how often you should have your eyes checked. Personal lifestyle choices, including: Daily care of your teeth and gums. Regular physical activity. Eating a healthy diet. Avoiding tobacco and drug use. Limiting alcohol use. Practicing safe sex. Taking low-dose aspirin every day. Taking vitamin and mineral supplements as recommended by your health care provider. What happens during an annual well check? The services and screenings done by your health care provider during your annual well check will depend on your age, overall health, lifestyle risk factors, and family history of disease. Counseling  Your health care provider may ask you questions about your: Alcohol use. Tobacco use. Drug use. Emotional well-being. Home and relationship well-being. Sexual activity. Eating habits. History of  falls. Memory and ability to understand (cognition). Work and work Statistician. Reproductive health. Screening  You may have the following tests or measurements: Height, weight, and BMI. Blood pressure. Lipid and cholesterol levels. These may be checked every 5 years, or more frequently if you are over 71 years old. Skin check. Lung cancer screening. You may have this screening every year starting at age 41 if you have a 30-pack-year history of smoking and currently smoke or have quit within the past 15 years. Fecal occult blood test (FOBT) of the stool. You may have this test every year starting at age 41. Flexible sigmoidoscopy or colonoscopy. You may have a sigmoidoscopy every 5 years or a colonoscopy every 10 years starting at age 72. Hepatitis C blood test. Hepatitis B blood test. Sexually transmitted disease (STD) testing. Diabetes screening. This is done by checking your blood sugar (glucose) after you have not eaten for a while (fasting). You may have this done every 1-3 years. Bone density scan. This is done to screen for osteoporosis. You may have this done starting at age 35. Mammogram. This may be done every 1-2 years. Talk to your health care provider about how often you should have regular mammograms. Talk with your health care provider about your test results, treatment options, and if necessary, the need for more tests. Vaccines  Your health care provider may recommend certain vaccines, such as: Influenza vaccine. This is recommended every year. Tetanus, diphtheria, and acellular pertussis (Tdap, Td) vaccine. You may need a Td booster every 10 years. Zoster vaccine. You may need this after age 71. Pneumococcal 13-valent conjugate (PCV13) vaccine. One dose is recommended after age 29. Pneumococcal polysaccharide (PPSV23) vaccine. One dose is recommended after age 13. Talk to your health care provider about which screenings and vaccines you  need and how often you need  them. This information is not intended to replace advice given to you by your health care provider. Make sure you discuss any questions you have with your health care provider. Document Released: 03/12/2015 Document Revised: 11/03/2015 Document Reviewed: 12/15/2014 Elsevier Interactive Patient Education  2017 [place] Prevention in the Home Falls can cause injuries. They can happen to people of all ages. There are many things you can do to make your home safe and to help prevent falls. What can I do on the outside of my home? Regularly fix the edges of walkways and driveways and fix any cracks. Remove anything that might make you trip as you walk through a door, such as a raised step or threshold. Trim any bushes or trees on the path to your home. Use bright outdoor lighting. Clear any walking paths of anything that might make someone trip, such as rocks or tools. Regularly check to see if handrails are loose or broken. Make sure that both sides of any steps have handrails. Any raised decks and porches should have guardrails on the edges. Have any leaves, snow, or ice cleared regularly. Use sand or salt on walking paths during winter. Clean up any spills in your garage right away. This includes oil or grease spills. What can I do in the bathroom? Use night lights. Install grab bars by the toilet and in the tub and shower. Do not use towel bars as grab bars. Use non-skid mats or decals in the tub or shower. If you need to sit down in the shower, use a plastic, non-slip stool. Keep the floor dry. Clean up any water that spills on the floor as soon as it happens. Remove soap buildup in the tub or shower regularly. Attach bath mats securely with double-sided non-slip rug tape. Do not have throw rugs and other things on the floor that can make you trip. What can I do in the bedroom? Use night lights. Make sure that you have a light by your bed that is easy to reach. Do not use  any sheets or blankets that are too big for your bed. They should not hang down onto the floor. Have a firm chair that has side arms. You can use this for support while you get dressed. Do not have throw rugs and other things on the floor that can make you trip. What can I do in the kitchen? Clean up any spills right away. Avoid walking on wet floors. Keep items that you use a lot in easy-to-reach places. If you need to reach something above you, use a strong step stool that has a grab bar. Keep electrical cords out of the way. Do not use floor polish or wax that makes floors slippery. If you must use wax, use non-skid floor wax. Do not have throw rugs and other things on the floor that can make you trip. What can I do with my stairs? Do not leave any items on the stairs. Make sure that there are handrails on both sides of the stairs and use them. Fix handrails that are broken or loose. Make sure that handrails are as long as the stairways. Check any carpeting to make sure that it is firmly attached to the stairs. Fix any carpet that is loose or worn. Avoid having throw rugs at the top or bottom of the stairs. If you do have throw rugs, attach them to the floor with carpet tape. Make sure that  you have a light switch at the top of the stairs and the bottom of the stairs. If you do not have them, ask someone to add them for you. What else can I do to help prevent falls? Wear shoes that: Do not have high heels. Have rubber bottoms. Are comfortable and fit you well. Are closed at the toe. Do not wear sandals. If you use a stepladder: Make sure that it is fully opened. Do not climb a closed stepladder. Make sure that both sides of the stepladder are locked into place. Ask someone to hold it for you, if possible. Clearly mark and make sure that you can see: Any grab bars or handrails. First and last steps. Where the edge of each step is. Use tools that help you move around (mobility aids)  if they are needed. These include: Canes. Walkers. Scooters. Crutches. Turn on the lights when you go into a dark area. Replace any light bulbs as soon as they burn out. Set up your furniture so you have a clear path. Avoid moving your furniture around. If any of your floors are uneven, fix them. If there are any pets around you, be aware of where they are. Review your medicines with your doctor. Some medicines can make you feel dizzy. This can increase your chance of falling. Ask your doctor what other things that you can do to help prevent falls. This information is not intended to replace advice given to you by your health care provider. Make sure you discuss any questions you have with your health care provider. Document Released: 12/10/2008 Document Revised: 07/22/2015 Document Reviewed: 03/20/2014 Elsevier Interactive Patient Education  2017 [employer].

## 2021-08-10 DIAGNOSIS — M25562 Pain in left knee: Secondary | ICD-10-CM | POA: Diagnosis not present

## 2021-08-10 DIAGNOSIS — M25561 Pain in right knee: Secondary | ICD-10-CM | POA: Diagnosis not present

## 2021-08-22 DIAGNOSIS — M2241 Chondromalacia patellae, right knee: Secondary | ICD-10-CM | POA: Diagnosis not present

## 2021-08-22 DIAGNOSIS — M2242 Chondromalacia patellae, left knee: Secondary | ICD-10-CM | POA: Diagnosis not present

## 2021-09-28 ENCOUNTER — Ambulatory Visit
Admission: RE | Admit: 2021-09-28 | Discharge: 2021-09-28 | Disposition: A | Discharge status: Home / Self Care | Payer: PPO | Source: Ambulatory Visit | Attending: Family Medicine | Admitting: Family Medicine

## 2021-09-28 DIAGNOSIS — Z1231 Encounter for screening mammogram for malignant neoplasm of breast: Secondary | ICD-10-CM | POA: Diagnosis not present

## 2022-01-30 DIAGNOSIS — L821 Other seborrheic keratosis: Secondary | ICD-10-CM | POA: Diagnosis not present

## 2022-01-30 DIAGNOSIS — Z129 Encounter for screening for malignant neoplasm, site unspecified: Secondary | ICD-10-CM | POA: Diagnosis not present

## 2022-01-30 DIAGNOSIS — C44311 Basal cell carcinoma of skin of nose: Secondary | ICD-10-CM | POA: Diagnosis not present

## 2022-01-30 DIAGNOSIS — L570 Actinic keratosis: Secondary | ICD-10-CM | POA: Diagnosis not present

## 2022-01-31 DIAGNOSIS — D485 Neoplasm of uncertain behavior of skin: Secondary | ICD-10-CM | POA: Diagnosis not present

## 2022-03-02 DIAGNOSIS — D485 Neoplasm of uncertain behavior of skin: Secondary | ICD-10-CM | POA: Diagnosis not present

## 2022-07-06 ENCOUNTER — Encounter: Payer: PPO | Admitting: Family Medicine

## 2022-07-10 ENCOUNTER — Ambulatory Visit (INDEPENDENT_AMBULATORY_CARE_PROVIDER_SITE_OTHER): Payer: PPO | Admitting: Family Medicine

## 2022-07-10 ENCOUNTER — Encounter: Payer: Self-pay | Admitting: Family Medicine

## 2022-07-10 VITALS — BP 116/68 | HR 80 | Temp 97.2°F | Ht 61.25 in | Wt 125.2 lb

## 2022-07-10 DIAGNOSIS — Z13820 Encounter for screening for osteoporosis: Secondary | ICD-10-CM

## 2022-07-10 DIAGNOSIS — Z13220 Encounter for screening for lipoid disorders: Secondary | ICD-10-CM

## 2022-07-10 DIAGNOSIS — N951 Menopausal and female climacteric states: Secondary | ICD-10-CM

## 2022-07-10 DIAGNOSIS — Z0000 Encounter for general adult medical examination without abnormal findings: Secondary | ICD-10-CM

## 2022-07-10 DIAGNOSIS — F17209 Nicotine dependence, unspecified, with unspecified nicotine-induced disorders: Secondary | ICD-10-CM | POA: Diagnosis not present

## 2022-07-10 LAB — LIPID PANEL
Cholesterol: 207 mg/dL — ABNORMAL HIGH (ref 0–200)
HDL: 74.4 mg/dL (ref >39.00)
LDL Cholesterol: 119 mg/dL — ABNORMAL HIGH (ref 0–99)
NonHDL: 132.22
Total CHOL/HDL Ratio: 3
Triglycerides: 68 mg/dL (ref 0.0–149.0)
VLDL: 13.6 mg/dL (ref 0.0–40.0)

## 2022-07-10 NOTE — Assessment & Plan Note (Signed)
Continue to advocate for smoking cessation. She continues to not be ready for this. She notes, intellectually, she understands the risk, but is no ready to make a change. She also declines LDCT for lung cancer screening at the present time.

## 2022-07-10 NOTE — Progress Notes (Signed)
[facility] Trecia Rogers [facility] [address] [place] [address] Dept: [phone] Dept Fax: [phone]  Annual Physical Visit  Subjective:    Patient ID: Jill Norris, female    DOB: 1950/03/14, 72 y.o..   MRN: [id number]  Chief Complaint  Patient presents with   Annual Exam    CPE/labs.   No concerns.  Fasting today.    History of Present Illness:  Patient is in today for an annual physical/preventative visit.  Review of Systems  Constitutional:  Negative for chills, diaphoresis, fever, malaise/fatigue and weight loss.  HENT:  Positive for congestion. Negative for ear pain, hearing loss, sinus pain, sore throat and tinnitus.        Mild congestion at times. No significant sneezing. she admits this may relate to her tobacco use.  Eyes:  Negative for blurred vision, pain, discharge and redness.  Respiratory:  Positive for cough. Negative for shortness of breath and wheezing.        Mild morning cough. Denies dyspnea. Continues to smoke 1/2 ppd of cigarettes.  Cardiovascular:  Negative for chest pain and palpitations.  Gastrointestinal:  Negative for abdominal pain, constipation, diarrhea, heartburn, nausea and vomiting.  Musculoskeletal:  Negative for back pain, joint pain and myalgias.  Skin:  Negative for itching and rash.       Past history of basal cell carcinoma of nose. No sing of recurrence.  Psychiatric/Behavioral:  Negative for depression. The patient is not nervous/anxious.    Past Medical History: Patient Active Problem List   Diagnosis Date Noted   Tobacco use disorder, continuous 07/04/2021   Basal cell carcinoma of nose 09/23/2020   Syncope 02/13/2013   Allergic rhinitis 12/06/2011   Post-nasal drip 12/06/2011   Past Surgical History:  Procedure Laterality Date   TONSILLECTOMY AND ADENOIDECTOMY  1964   Family History  Problem Relation Age of Onset   Cancer Mother        Pancreatic   Diabetes Mother     Dementia Mother    Heart disease Father    Stroke Father    Cancer Paternal Uncle        Lung   Outpatient Medications Prior to Visit  Medication Sig Dispense Refill   cholecalciferol (VITAMIN D3) 25 MCG (1000 UNIT) tablet Take 1,000 Units by mouth daily.     Emollient (COLLAGEN) CREA Apply topically.     fluorouracil (EFUDEX) 5 % cream Apply topically.     naproxen (NAPROSYN) 500 MG tablet Take 1 tablet (500 mg total) by mouth 2 (two) times daily with a meal. 14 tablet 0   Probiotic Product (UP4 PROBIOTICS ADULT PO) Take by mouth.     vitamin B-12 (CYANOCOBALAMIN) 1000 MCG tablet Take 1,000 mcg by mouth daily.     No facility-administered medications prior to visit.   No Known Allergies Objective:   Today's Vitals   07/10/22 0910  BP: 116/68  Pulse: 80  Temp: (!) 97.2 F (36.2 C)  TempSrc: Temporal  SpO2: 97%  Weight: 125 lb 3.2 oz (56.8 kg)  Height: 5' 1.25" (1.556 m)   Body mass index is 23.46 kg/m.   General: Well developed, well nourished. No acute distress. HEENT: Normocephalic, non-traumatic. PERRL, EOMI. Conjunctiva clear. External ears normal.   EAC and TMs normal bilaterally. Nose clear without congestion or rhinorrhea. Mucous membranes   moist. Oropharynx clear. Good dentition. Neck: Supple. No lymphadenopathy. No thyromegaly. Lungs: Clear to auscultation bilaterally. No wheezing, rales or rhonchi. CV: RRR without murmurs  or rubs. Pulses 2+ bilaterally. Abdomen: Soft, non-tender. Bowel sounds positive, normal pitch and frequency. No   hepatosplenomegaly. No rebound or guarding. Extremities: Full ROM. No joint swelling or tenderness. Mild crepitance of knees. No edema noted. Skin: Warm and dry. No rashes. Psych: Alert and oriented. Normal mood and affect.  Health Maintenance Due  Topic Date Due   Zoster Vaccines- Shingrix (1 of 2) Never done   DEXA SCAN  Never done   Medicare Annual Wellness (AWV)  08/10/2022     Assessment & Plan:   Problem List Items  Addressed This Visit       Other   Tobacco use disorder, continuous    Continue to advocate for smoking cessation. She continues to not be ready for this. She notes, intellectually, she understands the risk, but is no ready to make a change. She also declines LDCT for lung cancer screening at the present time.      Other Visit Diagnoses     Annual physical exam    -  Primary   Good health. I advised her to start doign some regualr exercise in order to [facility] health and prevent falls. Discussed shingles vaccination through pharamcy.   Menopausal state       Relevant Orders   DG Bone Density   Screening for osteoporosis       Relevant Orders   DG Bone Density   Screening for lipid disorders       Relevant Orders   Lipid panel       Return in about 1 year (around 07/10/2023) for Annual preventative care.   Loyola Mast, MD

## 2022-07-17 ENCOUNTER — Telehealth (HOSPITAL_BASED_OUTPATIENT_CLINIC_OR_DEPARTMENT_OTHER): Payer: Self-pay

## 2022-07-20 ENCOUNTER — Other Ambulatory Visit (HOSPITAL_BASED_OUTPATIENT_CLINIC_OR_DEPARTMENT_OTHER): Payer: PPO

## 2022-08-21 ENCOUNTER — Ambulatory Visit (INDEPENDENT_AMBULATORY_CARE_PROVIDER_SITE_OTHER): Payer: PPO

## 2022-08-21 VITALS — Ht 60.0 in | Wt 125.0 lb

## 2022-08-21 DIAGNOSIS — Z0000 Encounter for general adult medical examination without abnormal findings: Secondary | ICD-10-CM

## 2022-08-21 NOTE — Patient Instructions (Signed)
Jill Norris , Thank you for taking time to come for your Medicare Wellness Visit. I appreciate your ongoing commitment to your health goals. Please review the following plan we discussed and let me know if I can assist you in the future.   These are the goals we discussed:  Goals      Patient Stated     08/21/2022, no goals        This is a list of the screening recommended for you and due dates:  Health Maintenance  Topic Date Due   Zoster (Shingles) Vaccine (1 of 2) Never done   DEXA scan (bone density measurement)  Never done   COVID-19 Vaccine (4 - 2023-24 season) 10/28/2021   Screening for Lung Cancer  07/10/2023*   Flu Shot  09/28/2022   Mammogram  09/29/2022   Cologuard (Stool DNA test)  07/22/2023   Medicare Annual Wellness Visit  08/21/2023   DTaP/Tdap/Td vaccine (3 - Td or Tdap) 07/02/2030   Pneumonia Vaccine  Completed   Hepatitis C Screening  Completed   HPV Vaccine  Aged Out  *Topic was postponed. The date shown is not the original due date.    Advanced directives: Please bring a copy of your POA (Power of Attorney) and/or Living Will to your next appointment.   Conditions/risks identified: smoking  Next appointment: Follow up in one year for your annual wellness visit    Preventive Care 65 Years and Older, Female Preventive care refers to lifestyle choices and visits with your health care provider that can promote health and wellness. What does preventive care include? A yearly physical exam. This is also called an annual well check. Dental exams once or twice a year. Routine eye exams. Ask your health care provider how often you should have your eyes checked. Personal lifestyle choices, including: Daily care of your teeth and gums. Regular physical activity. Eating a healthy diet. Avoiding tobacco and drug use. Limiting alcohol use. Practicing safe sex. Taking low-dose aspirin every day. Taking vitamin and mineral supplements as recommended by your  health care provider. What happens during an annual well check? The services and screenings done by your health care provider during your annual well check will depend on your age, overall health, lifestyle risk factors, and family history of disease. Counseling  Your health care provider may ask you questions about your: Alcohol use. Tobacco use. Drug use. Emotional well-being. Home and relationship well-being. Sexual activity. Eating habits. History of falls. Memory and ability to understand (cognition). Work and work Astronomer. Reproductive health. Screening  You may have the following tests or measurements: Height, weight, and BMI. Blood pressure. Lipid and cholesterol levels. These may be checked every 5 years, or more frequently if you are over 61 years old. Skin check. Lung cancer screening. You may have this screening every year starting at age 48 if you have a 30-pack-year history of smoking and currently smoke or have quit within the past 15 years. Fecal occult blood test (FOBT) of the stool. You may have this test every year starting at age 64. Flexible sigmoidoscopy or colonoscopy. You may have a sigmoidoscopy every 5 years or a colonoscopy every 10 years starting at age 52. Hepatitis C blood test. Hepatitis B blood test. Sexually transmitted disease (STD) testing. Diabetes screening. This is done by checking your blood sugar (glucose) after you have not eaten for a while (fasting). You may have this done every 1-3 years. Bone density scan. This is done to screen for  osteoporosis. You may have this done starting at age 31. Mammogram. This may be done every 1-2 years. Talk to your health care provider about how often you should have regular mammograms. Talk with your health care provider about your test results, treatment options, and if necessary, the need for more tests. Vaccines  Your health care provider may recommend certain vaccines, such as: Influenza vaccine.  This is recommended every year. Tetanus, diphtheria, and acellular pertussis (Tdap, Td) vaccine. You may need a Td booster every 10 years. Zoster vaccine. You may need this after age 82. Pneumococcal 13-valent conjugate (PCV13) vaccine. One dose is recommended after age 69. Pneumococcal polysaccharide (PPSV23) vaccine. One dose is recommended after age 72. Talk to your health care provider about which screenings and vaccines you need and how often you need them. This information is not intended to replace advice given to you by your health care provider. Make sure you discuss any questions you have with your health care provider. Document Released: 03/12/2015 Document Revised: 11/03/2015 Document Reviewed: 12/15/2014 Elsevier Interactive Patient Education  2017 [place] Prevention in the Home Falls can cause injuries. They can happen to people of all ages. There are many things you can do to make your home safe and to help prevent falls. What can I do on the outside of my home? Regularly fix the edges of walkways and driveways and fix any cracks. Remove anything that might make you trip as you walk through a door, such as a raised step or threshold. Trim any bushes or trees on the path to your home. Use bright outdoor lighting. Clear any walking paths of anything that might make someone trip, such as rocks or tools. Regularly check to see if handrails are loose or broken. Make sure that both sides of any steps have handrails. Any raised decks and porches should have guardrails on the edges. Have any leaves, snow, or ice cleared regularly. Use sand or salt on walking paths during winter. Clean up any spills in your garage right away. This includes oil or grease spills. What can I do in the bathroom? Use night lights. Install grab bars by the toilet and in the tub and shower. Do not use towel bars as grab bars. Use non-skid mats or decals in the tub or shower. If you need to sit  down in the shower, use a plastic, non-slip stool. Keep the floor dry. Clean up any water that spills on the floor as soon as it happens. Remove soap buildup in the tub or shower regularly. Attach bath mats securely with double-sided non-slip rug tape. Do not have throw rugs and other things on the floor that can make you trip. What can I do in the bedroom? Use night lights. Make sure that you have a light by your bed that is easy to reach. Do not use any sheets or blankets that are too big for your bed. They should not hang down onto the floor. Have a firm chair that has side arms. You can use this for support while you get dressed. Do not have throw rugs and other things on the floor that can make you trip. What can I do in the kitchen? Clean up any spills right away. Avoid walking on wet floors. Keep items that you use a lot in easy-to-reach places. If you need to reach something above you, use a strong step stool that has a grab bar. Keep electrical cords out of the way. Do not  use floor polish or wax that makes floors slippery. If you must use wax, use non-skid floor wax. Do not have throw rugs and other things on the floor that can make you trip. What can I do with my stairs? Do not leave any items on the stairs. Make sure that there are handrails on both sides of the stairs and use them. Fix handrails that are broken or loose. Make sure that handrails are as long as the stairways. Check any carpeting to make sure that it is firmly attached to the stairs. Fix any carpet that is loose or worn. Avoid having throw rugs at the top or bottom of the stairs. If you do have throw rugs, attach them to the floor with carpet tape. Make sure that you have a light switch at the top of the stairs and the bottom of the stairs. If you do not have them, ask someone to add them for you. What else can I do to help prevent falls? Wear shoes that: Do not have high heels. Have rubber bottoms. Are  comfortable and fit you well. Are closed at the toe. Do not wear sandals. If you use a stepladder: Make sure that it is fully opened. Do not climb a closed stepladder. Make sure that both sides of the stepladder are locked into place. Ask someone to hold it for you, if possible. Clearly mark and make sure that you can see: Any grab bars or handrails. First and last steps. Where the edge of each step is. Use tools that help you move around (mobility aids) if they are needed. These include: Canes. Walkers. Scooters. Crutches. Turn on the lights when you go into a dark area. Replace any light bulbs as soon as they burn out. Set up your furniture so you have a clear path. Avoid moving your furniture around. If any of your floors are uneven, fix them. If there are any pets around you, be aware of where they are. Review your medicines with your doctor. Some medicines can make you feel dizzy. This can increase your chance of falling. Ask your doctor what other things that you can do to help prevent falls. This information is not intended to replace advice given to you by your health care provider. Make sure you discuss any questions you have with your health care provider. Document Released: 12/10/2008 Document Revised: 07/22/2015 Document Reviewed: 03/20/2014 Elsevier Interactive Patient Education  2017 [employer].

## 2022-08-21 NOTE — Progress Notes (Signed)
Subjective:   Jill Norris is a 72 y.o. female who presents for Medicare Annual (Subsequent) preventive examination.  Visit Complete: Virtual  I connected with  Ardelle Park on 08/21/22 by a audio enabled telemedicine application and verified that I am speaking with the correct person using two identifiers.  Patient Location: Home  Provider Location: Office/Clinic  I discussed the limitations of evaluation and management by telemedicine. The patient expressed understanding and agreed to proceed.  Patient Medicare AWV questionnaire was completed by the patient on 08/20/2022; I have confirmed that all information answered by patient is correct and no changes since this date.  Review of Systems     Cardiac Risk Factors include: advanced age (>85men, >80 women);smoking/ tobacco exposure     Objective:    Today's Vitals   08/21/22 1611  Weight: 125 lb (56.7 kg)  Height: 5' (1.524 m)   Body mass index is 24.41 kg/m.     08/21/2022    4:16 PM 08/09/2021   10:16 AM  Advanced Directives  Does Patient Have a Medical Advance Directive? Yes Yes  Type of Estate agent of [place];Living will Healthcare Power of [place];Living will  Copy of Healthcare Power of Attorney in Chart? No - copy requested No - copy requested    Current Medications (verified) Outpatient Encounter Medications as of 08/21/2022  Medication Sig   cholecalciferol (VITAMIN D3) 25 MCG (1000 UNIT) tablet Take 1,000 Units by mouth daily.   Emollient (COLLAGEN) CREA Apply topically.   Probiotic Product (UP4 PROBIOTICS ADULT PO) Take by mouth.   TURMERIC PO Take 1 capsule by mouth daily.   vitamin B-12 (CYANOCOBALAMIN) 1000 MCG tablet Take 1,000 mcg by mouth daily.   fluorouracil (EFUDEX) 5 % cream Apply topically. (Patient not taking: Reported on 08/21/2022)   naproxen (NAPROSYN) 500 MG tablet Take 1 tablet (500 mg total) by mouth 2 (two) times daily with a meal. (Patient not taking:  Reported on 08/21/2022)   No facility-administered encounter medications on file as of 08/21/2022.    Allergies (verified) Patient has no known allergies.   History: Past Medical History:  Diagnosis Date   Allergy    Basal cell carcinoma (BCC) of nasal tip    Syncope    Past Surgical History:  Procedure Laterality Date   TONSILLECTOMY AND ADENOIDECTOMY  1964   Family History  Problem Relation Age of Onset   Cancer Mother        Pancreatic   Diabetes Mother    Dementia Mother    Heart disease Father    Stroke Father    Cancer Paternal Uncle        Lung   Social History   Socioeconomic History   Marital status: Married    Spouse name: Not on file   Number of children: 2   Years of education: Not on file   Highest education level: Bachelor's degree (e.g., BA, AB, BS)  Occupational History   Not on file  Tobacco Use   Smoking status: Every Day    Packs/day: 0.50    Years: 40.00    Additional pack years: 0.00    Total pack years: 20.00    Types: Cigarettes   Smokeless tobacco: Never  Vaping Use   Vaping Use: Never used  Substance and Sexual Activity   Alcohol use: No   Drug use: No   Sexual activity: Yes  Other Topics Concern   Not on file  Social History Narrative   Not  on file   Social Determinants of Health   Financial Resource Strain: Low Risk  (08/20/2022)   Overall Financial Resource Strain (CARDIA)    Difficulty of Paying Living Expenses: Not hard at all  Food Insecurity: No Food Insecurity (08/20/2022)   Hunger Vital Sign    Worried About Running Out of Food in the Last Year: Never true    Ran Out of Food in the Last Year: Never true  Transportation Needs: No Transportation Needs (08/20/2022)   PRAPARE - Administrator, Civil Service (Medical): No    Lack of Transportation (Non-Medical): No  Physical Activity: Inactive (08/20/2022)   Exercise Vital Sign    Days of Exercise per Week: 0 days    Minutes of Exercise per Session: 0 min   Stress: No Stress Concern Present (08/20/2022)   [employer] of Occupational Health - Occupational Stress Questionnaire    Feeling of Stress : Only a little  Social Connections: Unknown (08/20/2022)   Social Connection and Isolation Panel [NHANES]    Frequency of Communication with Friends and Family: More than three times a week    Frequency of Social Gatherings with Friends and Family: Once a week    Attends Religious Services: Not on Marketing executive or Organizations: No    Attends Banker Meetings: Never    Marital Status: Married    Tobacco Counseling Ready to quit: No Counseling given: Not Answered   Clinical Intake:  Pre-visit preparation completed: Yes  Pain : No/denies pain     Nutritional Status: BMI of 19-24  Normal Nutritional Risks: None Diabetes: No  How often do you need to have someone help you when you read instructions, pamphlets, or other written materials from your doctor or pharmacy?: 1 - Never  Interpreter Needed?: No  Information entered by :: NAllen LPN   Activities of Daily Living    08/20/2022    8:09 AM  In your present state of health, do you have any difficulty performing the following activities:  Hearing? 0  Vision? 0  Difficulty concentrating or making decisions? 0  Walking or climbing stairs? 0  Dressing or bathing? 0  Doing errands, shopping? 0  Preparing Food and eating ? N  Using the Toilet? N  In the past six months, have you accidently leaked urine? N  Do you have problems with loss of bowel control? N  Managing your Medications? N  Managing your Finances? N  Housekeeping or managing your Housekeeping? N    Patient Care Team: Loyola Mast, MD as PCP - General (Family Medicine) Jodi Geralds, MD (Plastic Surgery) Mat Carne, DO (Optometry)  Indicate any recent Medical Services you may have received from other than [facility] providers in the past year (date may be approximate).      Assessment:   This is a routine wellness examination for [place].  Hearing/Vision screen Vision Screening - Comments:: Regular eye exams, [facility]  Dietary issues and exercise activities discussed:     Goals Addressed             This Visit's Progress    Patient Stated       08/21/2022, no goals       Depression Screen    08/21/2022    4:17 PM 07/10/2022    9:13 AM 08/09/2021   10:17 AM 08/09/2021   10:14 AM 07/04/2021    9:54 AM 07/01/2020   10:33 AM 02/13/2013  1:01 PM  PHQ 2/9 Scores  PHQ - 2 Score 0 0 0 0 0 0 0    Fall Risk    08/20/2022    8:09 AM 07/10/2022    9:12 AM 08/09/2021   10:17 AM 07/04/2021    9:54 AM 07/01/2020   10:33 AM  Fall Risk   Falls in the past year? 0 0 0 0 0  Number falls in past yr: 0 0 0 0   Injury with Fall? 0 0 0 0   Risk for fall due to : No Fall Risks No Fall Risks  No Fall Risks   Follow up Falls prevention discussed;Education provided;Falls evaluation completed Falls evaluation completed Falls evaluation completed;Education provided Falls evaluation completed     MEDICARE RISK AT HOME:   TIMED UP AND GO:  Was the test performed?  No    Cognitive Function:        08/21/2022    4:17 PM  6CIT Screen  What Year? 0 points  What month? 0 points  What time? 0 points  Count back from 20 0 points  Months in reverse 0 points  Repeat phrase 0 points  Total Score 0 points    Immunizations Immunization History  Administered Date(s) Administered   Influenza Split 11/27/2020   Moderna Sars-Covid-2 Vaccination 03/11/2019, 04/09/2019, 12/31/2019   Pneumococcal Conjugate-13 07/01/2020   Pneumococcal Polysaccharide-23 07/04/2021   Td 07/01/2020   Tdap 02/13/2010    TDAP status: Up to date  Flu Vaccine status: Up to date  Pneumococcal vaccine status: Up to date  Covid-19 vaccine status: Completed vaccines  Qualifies for Shingles Vaccine? Yes   Zostavax completed No   Shingrix Completed?: No.    Education has  been provided regarding the importance of this vaccine. Patient has been advised to call insurance company to determine out of pocket expense if they have not yet received this vaccine. Advised may also receive vaccine at local pharmacy or Health Dept. Verbalized acceptance and understanding.  Screening Tests Health Maintenance  Topic Date Due   Zoster Vaccines- Shingrix (1 of 2) Never done   DEXA SCAN  Never done   COVID-19 Vaccine (4 - 2023-24 season) 10/28/2021   Lung Cancer Screening  07/10/2023 (Originally 05/31/2000)   INFLUENZA VACCINE  09/28/2022   MAMMOGRAM  09/29/2022   Fecal DNA (Cologuard)  07/22/2023   Medicare Annual Wellness (AWV)  08/21/2023   DTaP/Tdap/Td (3 - Td or Tdap) 07/02/2030   Pneumonia Vaccine 54+ Years old  Completed   Hepatitis C Screening  Completed   HPV VACCINES  Aged Out    Health Maintenance  Health Maintenance Due  Topic Date Due   Zoster Vaccines- Shingrix (1 of 2) Never done   DEXA SCAN  Never done   COVID-19 Vaccine (4 - 2023-24 season) 10/28/2021    Colorectal cancer screening: Type of screening: Cologuard. Completed 07/21/2020. Repeat every 3 years  Mammogram status: Completed 09/28/2021. Repeat every year  Bone Density status: Ordered 07/10/2022. Pt provided with contact info and advised to call to schedule appt.  Lung Cancer Screening: (Low Dose CT Chest recommended if Age 55-80 years, 20 pack-year currently smoking OR have quit w/in 15years.) does qualify.   Lung Cancer Screening Referral:   Additional Screening:  Hepatitis C Screening: does qualify; Completed 07/02/2020  Vision Screening: Recommended annual ophthalmology exams for early detection of glaucoma and other disorders of the eye. Is the patient up to date with their annual eye exam?  Yes  Who  is the provider or what is the name of the office in which the patient attends annual eye exams? [facility] If pt is not established with a provider, would they like to be  referred to a provider to establish care? No .   Dental Screening: Recommended annual dental exams for proper oral hygiene  Diabetic Foot Exam: n/a  Community Resource Referral / Chronic Care Management: CRR required this visit?  No   CCM required this visit?  No     Plan:     I have personally reviewed and noted the following in the patient's chart:   Medical and social history Use of alcohol, tobacco or illicit drugs  Current medications and supplements including opioid prescriptions. Patient is not currently taking opioid prescriptions. Functional ability and status Nutritional status Physical activity Advanced directives List of other physicians Hospitalizations, surgeries, and ER visits in previous 12 months Vitals Screenings to include cognitive, depression, and falls Referrals and appointments  In addition, I have reviewed and discussed with patient certain preventive protocols, quality metrics, and best practice recommendations. A written personalized care plan for preventive services as well as general preventive health recommendations were provided to patient.     Barb Merino, LPN   [id number]   After Visit Summary: (MyChart) Due to this being a telephonic visit, the after visit summary with patients personalized plan was offered to patient via MyChart   Nurse Notes: none

## 2022-10-15 ENCOUNTER — Telehealth: Payer: PPO | Admitting: Family Medicine

## 2022-10-15 DIAGNOSIS — U071 COVID-19: Secondary | ICD-10-CM

## 2022-10-15 MED ORDER — NIRMATRELVIR/RITONAVIR (PAXLOVID)TABLET: 3.0000 | ORAL_TABLET | Freq: Two times a day (BID) | ORAL | 0 refills | Status: AC

## 2022-10-15 NOTE — Patient Instructions (Signed)

## 2022-10-15 NOTE — Progress Notes (Signed)
Virtual Visit Consent   Jill Norris, you are scheduled for a virtual visit with a [place] provider today. Just as with appointments in the office, your consent must be obtained to participate. Your consent will be active for this visit and any virtual visit you may have with one of our providers in the next 365 days. If you have a MyChart account, a copy of this consent can be sent to you electronically.  As this is a virtual visit, video technology does not allow for your provider to perform a traditional examination. This may limit your provider's ability to fully assess your condition. If your provider identifies any concerns that need to be evaluated in person or the need to arrange testing (such as labs, EKG, etc.), we will make arrangements to do so. Although advances in technology are sophisticated, we cannot ensure that it will always work on either your end or our end. If the connection with a video visit is poor, the visit may have to be switched to a telephone visit. With either a video or telephone visit, we are not always able to ensure that we have a secure connection.  By engaging in this virtual visit, you consent to the provision of healthcare and authorize for your insurance to be billed (if applicable) for the services provided during this visit. Depending on your insurance coverage, you may receive a charge related to this service.  I need to obtain your verbal consent now. Are you willing to proceed with your visit today? TYKISHA COURTEMANCHE has provided verbal consent on 10/15/2022 for a virtual visit (video or telephone). Georgana Curio, FNP  Date: 10/15/2022 10:51 AM  Virtual Visit via Video Note   I, Georgana Curio, connected with  Jill Norris  ([id number], 03-03-1950) on 10/15/22 at 11:00 AM EDT by a video-enabled telemedicine application and verified that I am speaking with the correct person using two identifiers.  Location: Patient: Virtual Visit Location Patient:  Home Provider: Virtual Visit Location Provider: Home Office   I discussed the limitations of evaluation and management by telemedicine and the availability of in person appointments. The patient expressed understanding and agreed to proceed.    History of Present Illness: Jill Norris is a 72 y.o. who identifies as a female who was assigned female at birth, and is being seen today for positive covid test today. She has head congestion and fatigue. No wheezing, sob or fever. Marland Kitchen  HPI: HPI  Problems:  Patient Active Problem List   Diagnosis Date Noted   Tobacco use disorder, continuous 07/04/2021   Basal cell carcinoma of nose 09/23/2020   Syncope 02/13/2013   Allergic rhinitis 12/06/2011   Post-nasal drip 12/06/2011    Allergies: No Known Allergies Medications:  Current Outpatient Medications:    cholecalciferol (VITAMIN D3) 25 MCG (1000 UNIT) tablet, Take 1,000 Units by mouth daily., Disp: , Rfl:    Emollient (COLLAGEN) CREA, Apply topically., Disp: , Rfl:    fluorouracil (EFUDEX) 5 % cream, Apply topically. (Patient not taking: Reported on 08/21/2022), Disp: , Rfl:    naproxen (NAPROSYN) 500 MG tablet, Take 1 tablet (500 mg total) by mouth 2 (two) times daily with a meal. (Patient not taking: Reported on 08/21/2022), Disp: 14 tablet, Rfl: 0   Probiotic Product (UP4 PROBIOTICS ADULT PO), Take by mouth., Disp: , Rfl:    TURMERIC PO, Take 1 capsule by mouth daily., Disp: , Rfl:    vitamin B-12 (CYANOCOBALAMIN) 1000 MCG tablet, Take 1,000  mcg by mouth daily., Disp: , Rfl:   Observations/Objective: Patient is well-developed, well-nourished in no acute distress.  Resting comfortably  at home.  Head is normocephalic, atraumatic.  No labored breathing.  Speech is clear and coherent with logical content.  Patient is alert and oriented at baseline.    Assessment and Plan: 1. COVID  Increase fluids, humidifier at night, tylenol or ibuprofen as directed, Vit D and zinc, UC if sx persist  or worsen.   Follow Up Instructions: I discussed the assessment and treatment plan with the patient. The patient was provided an opportunity to ask questions and all were answered. The patient agreed with the plan and demonstrated an understanding of the instructions.  A copy of instructions were sent to the patient via MyChart unless otherwise noted below.     The patient was advised to call back or seek an in-person evaluation if the symptoms worsen or if the condition fails to improve as anticipated.  Time:  I spent 10 minutes with the patient via telehealth technology discussing the above problems/concerns.    Georgana Curio, FNP

## 2022-12-28 DIAGNOSIS — H18821 Corneal disorder due to contact lens, right eye: Secondary | ICD-10-CM | POA: Diagnosis not present

## 2023-01-01 DIAGNOSIS — H18821 Corneal disorder due to contact lens, right eye: Secondary | ICD-10-CM | POA: Diagnosis not present

## 2023-01-03 DIAGNOSIS — H2513 Age-related nuclear cataract, bilateral: Secondary | ICD-10-CM | POA: Diagnosis not present

## 2023-01-03 DIAGNOSIS — H18821 Corneal disorder due to contact lens, right eye: Secondary | ICD-10-CM | POA: Diagnosis not present

## 2023-01-10 DIAGNOSIS — H18821 Corneal disorder due to contact lens, right eye: Secondary | ICD-10-CM | POA: Diagnosis not present

## 2023-01-10 DIAGNOSIS — H02834 Dermatochalasis of left upper eyelid: Secondary | ICD-10-CM | POA: Diagnosis not present

## 2023-01-10 DIAGNOSIS — H02831 Dermatochalasis of right upper eyelid: Secondary | ICD-10-CM | POA: Diagnosis not present

## 2023-03-05 DIAGNOSIS — H02834 Dermatochalasis of left upper eyelid: Secondary | ICD-10-CM | POA: Diagnosis not present

## 2023-03-05 DIAGNOSIS — H02831 Dermatochalasis of right upper eyelid: Secondary | ICD-10-CM | POA: Diagnosis not present

## 2023-03-05 DIAGNOSIS — H2513 Age-related nuclear cataract, bilateral: Secondary | ICD-10-CM | POA: Diagnosis not present

## 2023-03-05 DIAGNOSIS — H40033 Anatomical narrow angle, bilateral: Secondary | ICD-10-CM | POA: Diagnosis not present

## 2023-03-26 DIAGNOSIS — H2512 Age-related nuclear cataract, left eye: Secondary | ICD-10-CM | POA: Diagnosis not present

## 2023-04-17 DIAGNOSIS — H2512 Age-related nuclear cataract, left eye: Secondary | ICD-10-CM | POA: Diagnosis not present

## 2023-07-10 ENCOUNTER — Encounter: Payer: PPO | Admitting: Family Medicine

## 2023-08-22 ENCOUNTER — Ambulatory Visit: Payer: Self-pay | Admitting: Family Medicine

## 2023-08-22 ENCOUNTER — Encounter: Payer: Self-pay | Admitting: Family Medicine

## 2023-08-22 ENCOUNTER — Ambulatory Visit (INDEPENDENT_AMBULATORY_CARE_PROVIDER_SITE_OTHER): Admitting: Family Medicine

## 2023-08-22 VITALS — BP 116/68 | HR 88 | Temp 97.4°F | Ht 61.0 in | Wt 124.8 lb

## 2023-08-22 DIAGNOSIS — Z1211 Encounter for screening for malignant neoplasm of colon: Secondary | ICD-10-CM | POA: Diagnosis not present

## 2023-08-22 DIAGNOSIS — E785 Hyperlipidemia, unspecified: Secondary | ICD-10-CM | POA: Insufficient documentation

## 2023-08-22 DIAGNOSIS — Z0000 Encounter for general adult medical examination without abnormal findings: Secondary | ICD-10-CM | POA: Insufficient documentation

## 2023-08-22 DIAGNOSIS — F17209 Nicotine dependence, unspecified, with unspecified nicotine-induced disorders: Secondary | ICD-10-CM

## 2023-08-22 DIAGNOSIS — Z1231 Encounter for screening mammogram for malignant neoplasm of breast: Secondary | ICD-10-CM | POA: Diagnosis not present

## 2023-08-22 LAB — LIPID PANEL
Cholesterol: 207 mg/dL — ABNORMAL HIGH (ref 0–200)
HDL: 79.3 mg/dL (ref >39.00)
LDL Cholesterol: 114 mg/dL — ABNORMAL HIGH (ref 0–99)
NonHDL: 128.11
Total CHOL/HDL Ratio: 3
Triglycerides: 73 mg/dL (ref 0.0–149.0)
VLDL: 14.6 mg/dL (ref 0.0–40.0)

## 2023-08-22 NOTE — Assessment & Plan Note (Signed)
Continue to advocate for smoking cessation. She continues to not be ready for this. She notes, intellectually, she understands the risk, but is no ready to make a change. She also declines LDCT for lung cancer screening at the present time.

## 2023-08-22 NOTE — Progress Notes (Signed)
 [facility] PRIMARY [facility]ARE [facility] [address] [place] [address] Dept: [phone] Dept Fax: [phone]  Annual Physical Visit  Subjective:    Patient ID: Jill Norris, female    DOB: 08-26-1950, 73 y.o..   MRN: [id number]  Chief Complaint  Patient presents with   Annual Exam    CPE/labs.  Fasting today.     History of Present Illness:  Patient is in today for an annual physical/preventative visit.  Review of Systems  Constitutional:  Negative for chills, diaphoresis, fever, malaise/fatigue and weight loss.  HENT:  Negative for congestion, ear pain, hearing loss, sinus pain, sore throat and tinnitus.   Eyes:  Negative for blurred vision, pain, discharge and redness.       Has had one cataract replacement in the past year. The IOL was selected for distance vision. She is struggling with decisions regarding the other eye, related to what type of lens to use.  Respiratory:  Positive for cough. Negative for shortness of breath and wheezing.        Occasional cough. Jill Norris feels this is triggered by allergens, as it seems to occur in specific environments.  Cardiovascular:  Negative for chest pain and palpitations.  Gastrointestinal:  Negative for abdominal pain, constipation, diarrhea, heartburn, nausea and vomiting.       Notes occasional gassiness. She eats yogurt everyday at lunch. She wonders if this is related.  Musculoskeletal:  Negative for back pain, joint pain and myalgias.       Notes hands and calf/foot cramping, esp. at night.  Skin:  Negative for itching and rash.  Psychiatric/Behavioral:  Negative for depression. The patient is not nervous/anxious.    Past Medical History: Patient Active Problem List   Diagnosis Date Noted   Borderline hyperlipidemia 08/22/2023   Tobacco use disorder, continuous 07/04/2021   Basal cell carcinoma of nose 09/23/2020   Syncope 02/13/2013   Allergic rhinitis 12/06/2011   Post-nasal drip  12/06/2011   Past Surgical History:  Procedure Laterality Date   TONSILLECTOMY AND ADENOIDECTOMY  1964   Family History  Problem Relation Age of Onset   Cancer Mother        Pancreatic   Diabetes Mother    Dementia Mother    Heart disease Father    Stroke Father    Cancer Paternal Uncle        Lung   Outpatient Medications Prior to Visit  Medication Sig Dispense Refill   cholecalciferol (VITAMIN D3) 25 MCG (1000 UNIT) tablet Take 1,000 Units by mouth daily.     Emollient (COLLAGEN) CREA Apply topically.     Multiple Vitamin (MULTIVITAMIN) tablet Take 1 tablet by mouth daily.     Omega-3 Fatty Acids (FISH OIL) 1000 MG CAPS Take by mouth.     Probiotic Product (UP4 PROBIOTICS ADULT PO) Take by mouth.     TURMERIC PO Take 1 capsule by mouth daily.     vitamin B-12 (CYANOCOBALAMIN) 1000 MCG tablet Take 1,000 mcg by mouth daily.     naproxen  (NAPROSYN ) 500 MG tablet Take 1 tablet (500 mg total) by mouth 2 (two) times daily with a meal. (Patient not taking: Reported on 08/21/2022) 14 tablet 0   fluorouracil (EFUDEX) 5 % cream Apply topically. (Patient not taking: Reported on 08/22/2023)     No facility-administered medications prior to visit.   No Known Allergies Objective:   Today's Vitals   08/22/23 0840  BP: 116/68  Pulse: 88  Temp: (!) 97.4  F (36.3 C)  TempSrc: Temporal  SpO2: 100%  Weight: 124 lb 12.8 oz (56.6 kg)  Height: 5' 1 (1.549 m)   Body mass index is 23.58 kg/m.   General: Well developed, well nourished. No acute distress. HEENT: Normocephalic, non-traumatic. PERRL, EOMI. Conjunctiva clear. External ears normal. EAC and TMs   normal bilaterally. Nose clear without congestion or rhinorrhea. Mucous membranes moist. Oropharynx clear.   Good dentition. Neck: Supple. No lymphadenopathy. No thyromegaly. Lungs: Clear to auscultation bilaterally. No wheezing, rales or rhonchi. CV: RRR without murmurs or rubs. Pulses 2+ bilaterally. Abdomen: Soft, non-tender.  Bowel sounds positive, normal pitch and frequency. No hepatosplenomegaly. No   rebound or guarding. Extremities: Full ROM. No joint swelling or tenderness. No edema noted. Skin: Warm and dry. No rashes. Psych: Alert and oriented. Normal mood and affect.  Health Maintenance Due  Topic Date Due   Zoster Vaccines- Shingrix (1 of 2) Never done   Lung Cancer Screening  Never done   DEXA SCAN  Never done   MAMMOGRAM  09/29/2022   Fecal DNA (Cologuard)  07/22/2023   Medicare Annual Wellness (AWV)  08/21/2023     Assessment & Plan:   Problem List Items Addressed This Visit       Other   Annual physical exam - Primary   Overall health is fair. Discussed recommended screenings and immunizations. Jill Norris is willing to continue breast cancer and colon cancer screening. She declines lung cancer and osteoporosis screening.       Borderline hyperlipidemia   I will reassess lipids today. Jill Norris is potentially at an increased CV risk due to her tobacco use.      Relevant Orders   Lipid panel   Tobacco use disorder, continuous   Continue to advocate for smoking cessation. She continues to not be ready for this. She notes, intellectually, she understands the risk, but is no ready to make a change. She also declines LDCT for lung cancer screening at the present time.      Other Visit Diagnoses       Screening for colon cancer       Relevant Orders   Cologuard     Encounter for screening mammogram for malignant neoplasm of breast       Relevant Orders   MM DIGITAL SCREENING BILATERAL       Return in about 1 year (around 08/21/2024) for Annual preventative care.   Garnette CHRISTELLA Simpler, MD

## 2023-08-22 NOTE — Assessment & Plan Note (Signed)
 I will reassess lipids today. Jill Norris is potentially at an increased CV risk due to her tobacco use.

## 2023-08-22 NOTE — Assessment & Plan Note (Signed)
 Overall health is fair. Discussed recommended screenings and immunizations. Jill Norris is willing to continue breast cancer and colon cancer screening. She declines lung cancer and osteoporosis screening.

## 2023-08-27 DIAGNOSIS — Z1211 Encounter for screening for malignant neoplasm of colon: Secondary | ICD-10-CM | POA: Diagnosis not present

## 2023-08-31 LAB — COLOGUARD: COLOGUARD: NEGATIVE

## 2023-10-15 ENCOUNTER — Ambulatory Visit
Admission: RE | Admit: 2023-10-15 | Discharge: 2023-10-15 | Disposition: A | Discharge status: Home / Self Care | Source: Ambulatory Visit | Attending: Family Medicine | Admitting: Family Medicine

## 2023-10-15 DIAGNOSIS — Z1231 Encounter for screening mammogram for malignant neoplasm of breast: Secondary | ICD-10-CM

## 2023-11-03 IMAGING — DX DG KNEE COMPLETE 4+V*L*
4 series · 4 of 4 positions shown · non-contrast
Comparison: None.

CLINICAL DATA: Recent trauma, pain

EXAM:
LEFT KNEE - COMPLETE weight bear 4+ VIEW

[knee ap]
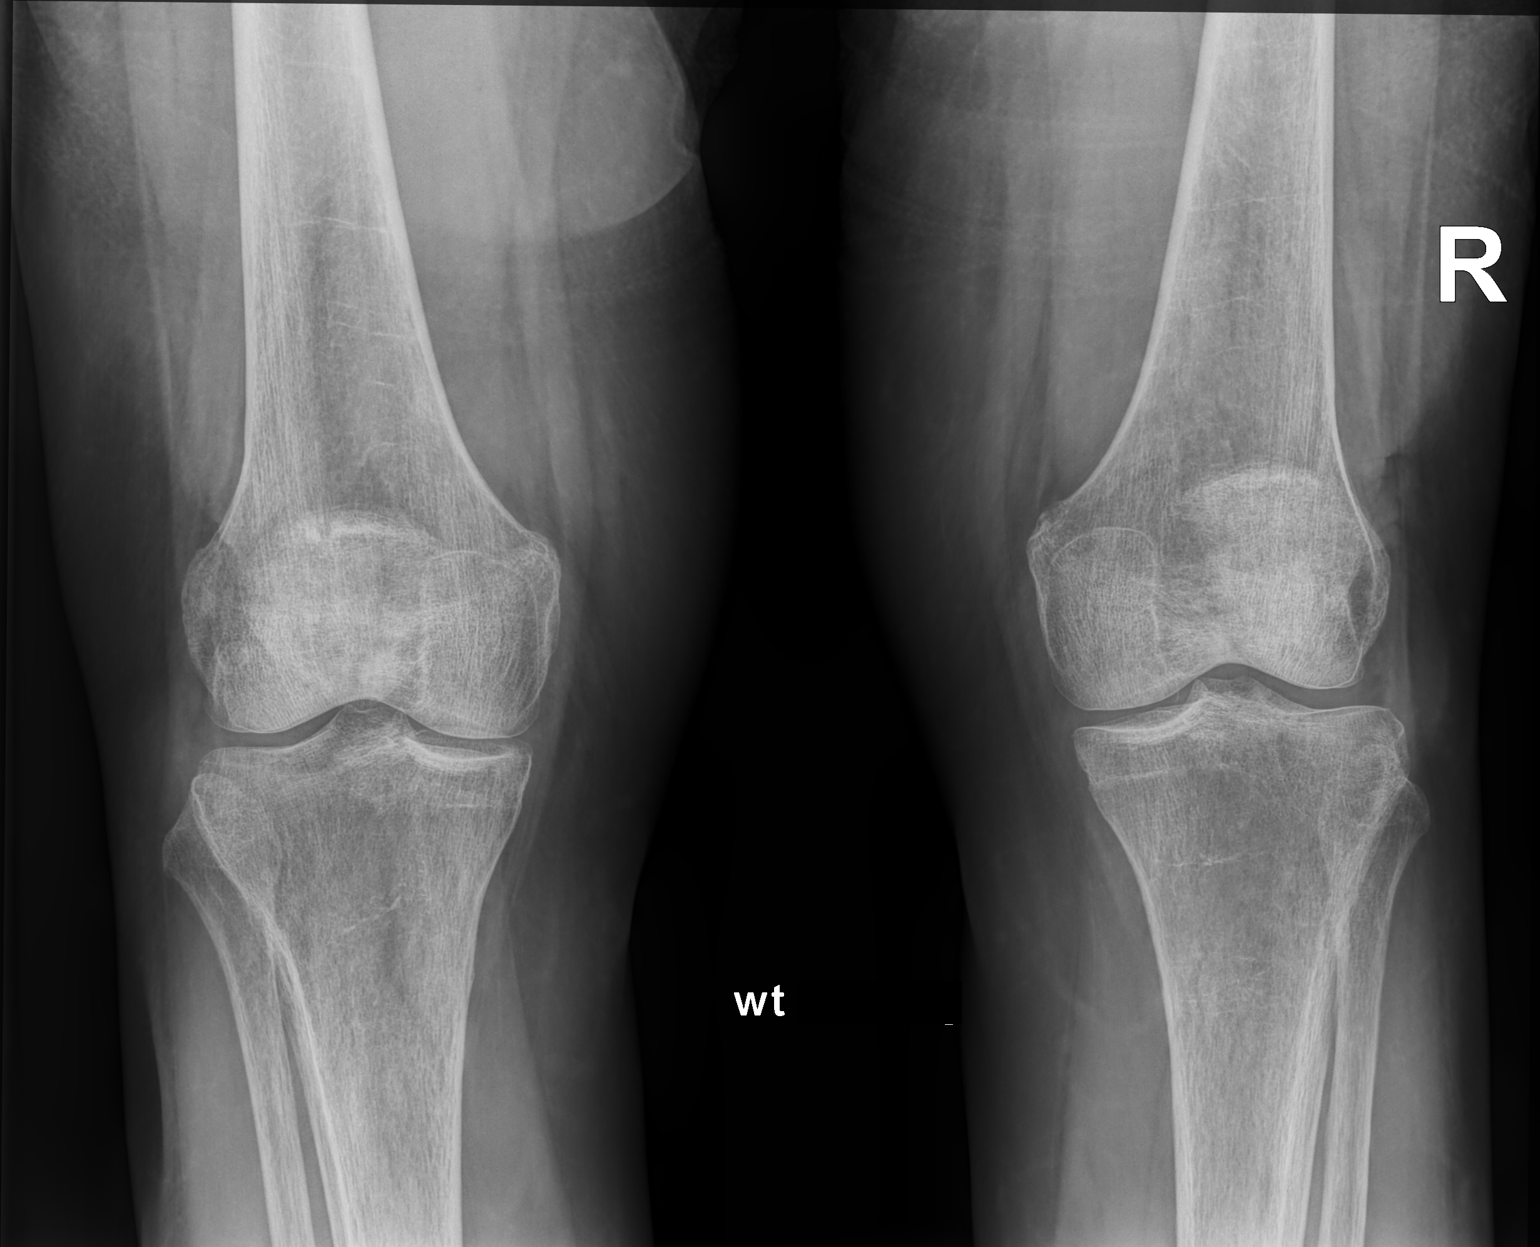

[knee [person_name] view pa]
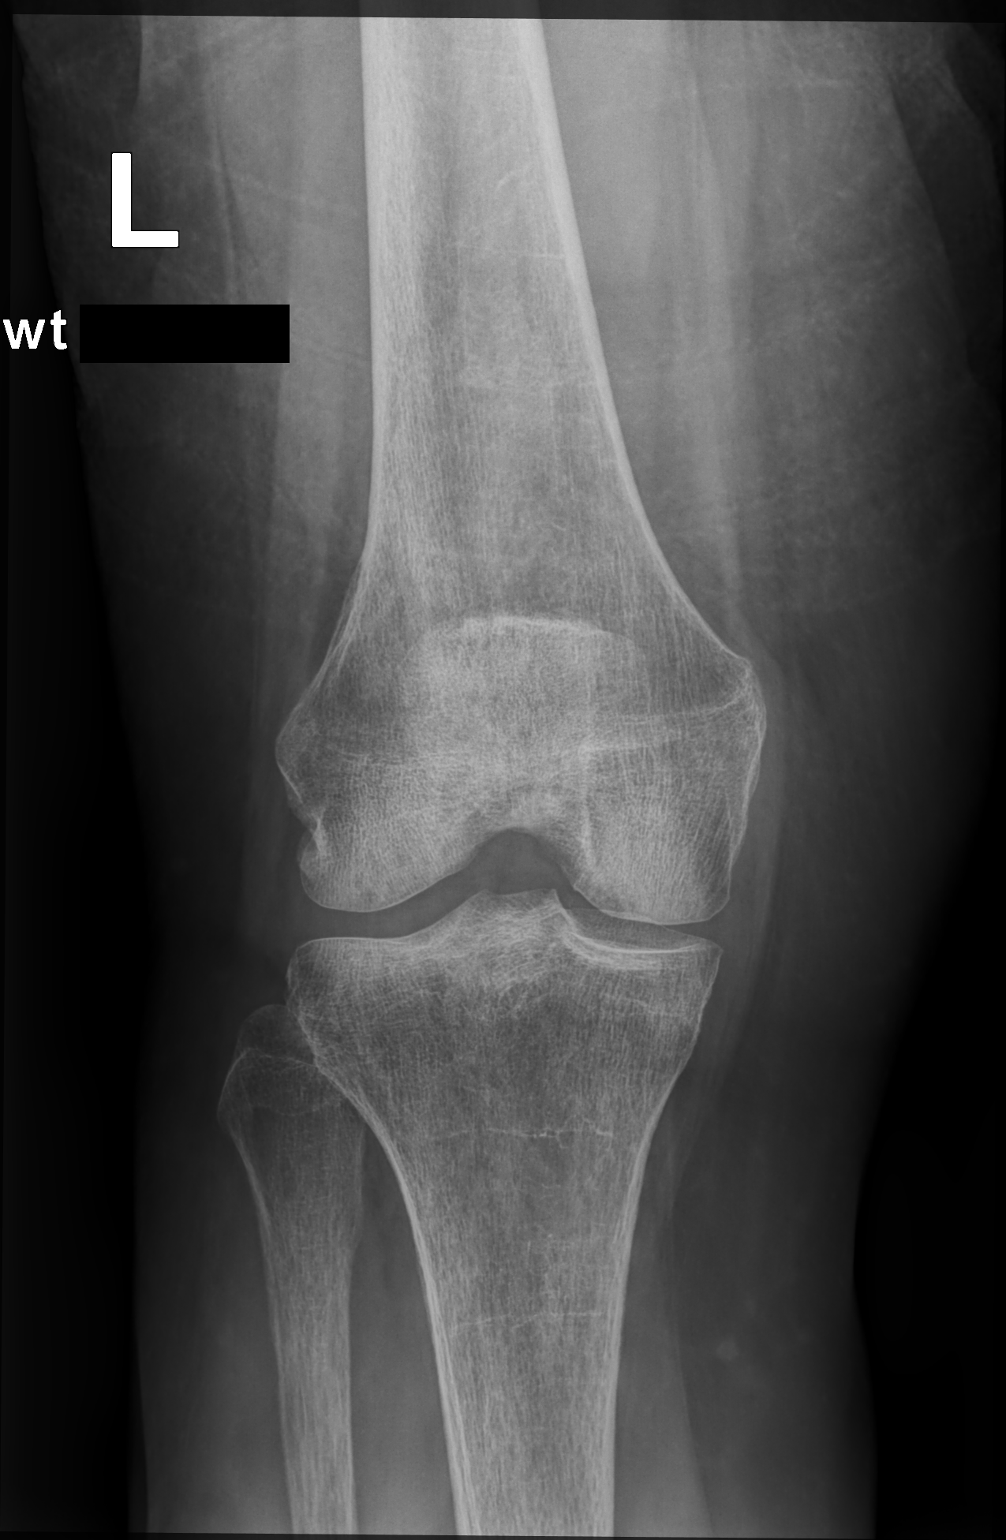

[knee lat]
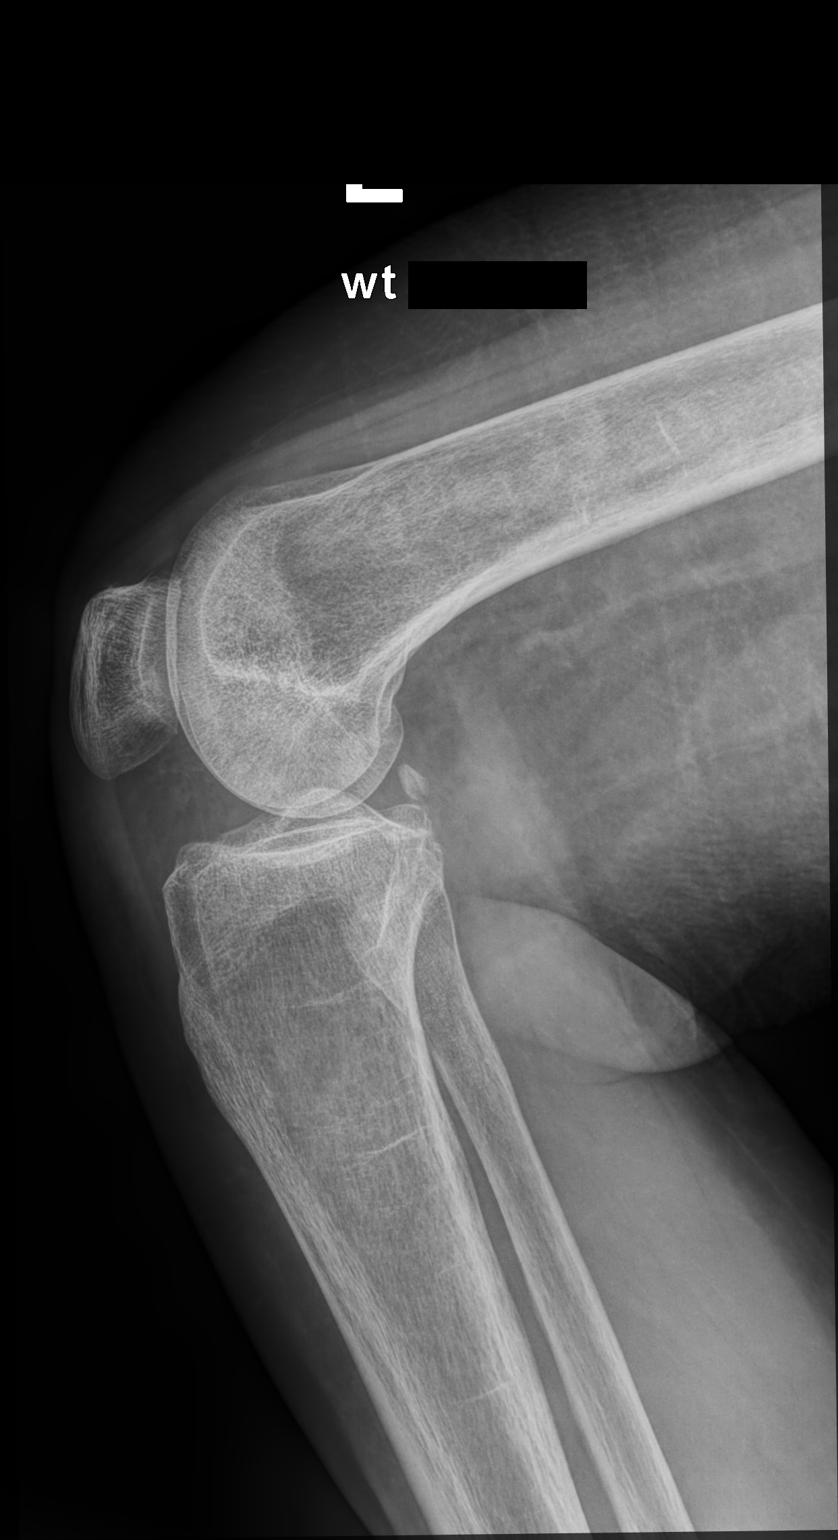

[patella (sunrise)]
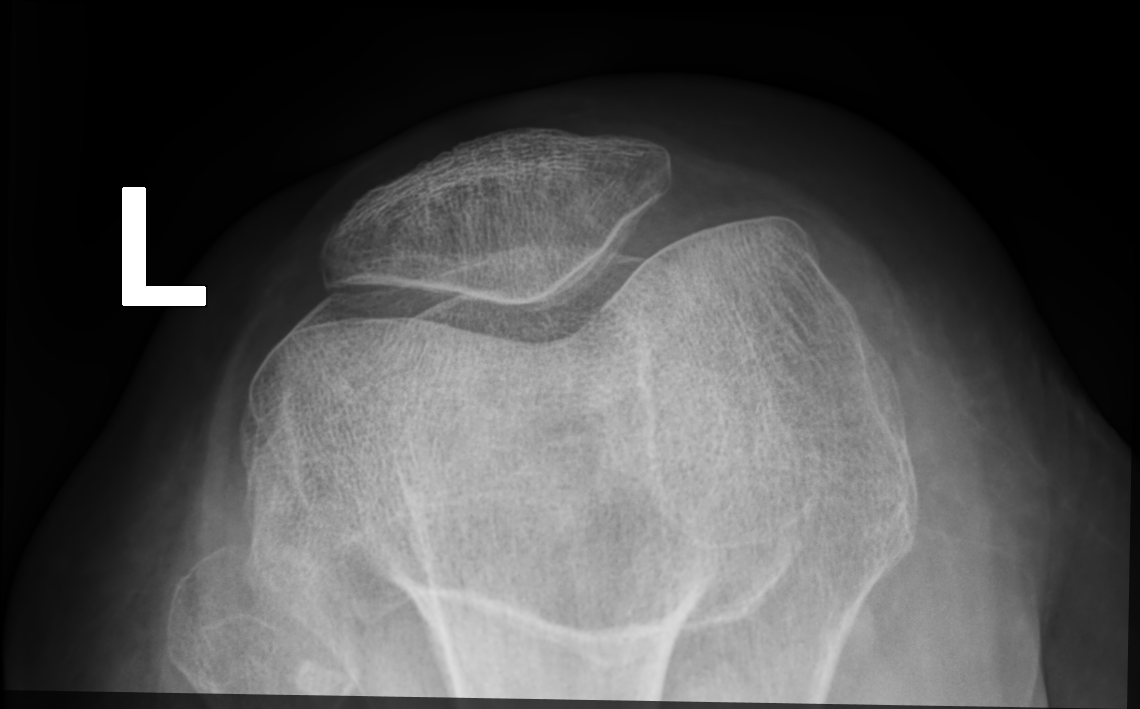

[4 of 4 positions shown; findings below may reference images not displayed]

FINDINGS: No recent fracture or dislocation is seen. Osteopenia is seen in
bony structures. There is no significant effusion in the
suprapatellar bursa. Minimal bony spurs seen in the patella.
IMPRESSION: No recent fracture or dislocation is seen. There is no significant
effusion. Minimal bony spurs seen in the patella.

## 2023-12-07 DIAGNOSIS — H52221 Regular astigmatism, right eye: Secondary | ICD-10-CM | POA: Diagnosis not present

## 2023-12-07 DIAGNOSIS — H5212 Myopia, left eye: Secondary | ICD-10-CM | POA: Diagnosis not present

## 2023-12-07 DIAGNOSIS — H02831 Dermatochalasis of right upper eyelid: Secondary | ICD-10-CM | POA: Diagnosis not present

## 2023-12-07 DIAGNOSIS — H5201 Hypermetropia, right eye: Secondary | ICD-10-CM | POA: Diagnosis not present

## 2023-12-07 DIAGNOSIS — H02834 Dermatochalasis of left upper eyelid: Secondary | ICD-10-CM | POA: Diagnosis not present

## 2023-12-07 DIAGNOSIS — H2511 Age-related nuclear cataract, right eye: Secondary | ICD-10-CM | POA: Diagnosis not present

## 2023-12-07 DIAGNOSIS — H26492 Other secondary cataract, left eye: Secondary | ICD-10-CM | POA: Diagnosis not present

## 2023-12-07 DIAGNOSIS — H524 Presbyopia: Secondary | ICD-10-CM | POA: Diagnosis not present

## 2024-01-04 ENCOUNTER — Ambulatory Visit

## 2024-08-22 ENCOUNTER — Encounter: Admitting: Family Medicine
